# Patient Record
Sex: Male | Born: 1960 | Race: White | Hispanic: No | Marital: Single | Smoking: Never smoker
Health system: Southern US, Community
[De-identification: ages and names within clinical notes are randomized; demographics above are authoritative.]

## PROBLEM LIST (undated history)

## (undated) DIAGNOSIS — G4730 Sleep apnea, unspecified: Secondary | ICD-10-CM

## (undated) DIAGNOSIS — E119 Type 2 diabetes mellitus without complications: Secondary | ICD-10-CM

## (undated) DIAGNOSIS — I10 Essential (primary) hypertension: Secondary | ICD-10-CM

## (undated) DIAGNOSIS — J45909 Unspecified asthma, uncomplicated: Secondary | ICD-10-CM

## (undated) DIAGNOSIS — IMO0002 Reserved for concepts with insufficient information to code with codable children: Secondary | ICD-10-CM

## (undated) HISTORY — PX: CARDIAC CATHETERIZATION: SHX172

## (undated) HISTORY — PX: LEG SURGERY: SHX1003

---

## 2014-11-18 ENCOUNTER — Inpatient Hospital Stay (HOSPITAL_COMMUNITY)
Admission: EM | Admit: 2014-11-18 | Discharge: 2014-11-21 | DRG: 292 | Disposition: A | Discharge status: Home / Self Care | Payer: Self-pay | Attending: Internal Medicine | Admitting: Internal Medicine

## 2014-11-18 ENCOUNTER — Emergency Department (HOSPITAL_COMMUNITY): Payer: Self-pay

## 2014-11-18 ENCOUNTER — Encounter (HOSPITAL_COMMUNITY): Payer: Self-pay

## 2014-11-18 DIAGNOSIS — R000 Tachycardia, unspecified: Secondary | ICD-10-CM | POA: Diagnosis present

## 2014-11-18 DIAGNOSIS — I43 Cardiomyopathy in diseases classified elsewhere: Secondary | ICD-10-CM | POA: Diagnosis present

## 2014-11-18 DIAGNOSIS — Z9114 Patient's other noncompliance with medication regimen: Secondary | ICD-10-CM | POA: Diagnosis present

## 2014-11-18 DIAGNOSIS — J45909 Unspecified asthma, uncomplicated: Secondary | ICD-10-CM | POA: Diagnosis present

## 2014-11-18 DIAGNOSIS — I5021 Acute systolic (congestive) heart failure: Secondary | ICD-10-CM

## 2014-11-18 DIAGNOSIS — I5023 Acute on chronic systolic (congestive) heart failure: Principal | ICD-10-CM | POA: Diagnosis present

## 2014-11-18 DIAGNOSIS — R51 Headache: Secondary | ICD-10-CM | POA: Diagnosis present

## 2014-11-18 DIAGNOSIS — E876 Hypokalemia: Secondary | ICD-10-CM | POA: Diagnosis not present

## 2014-11-18 DIAGNOSIS — Z91199 Patient's noncompliance with other medical treatment and regimen due to unspecified reason: Secondary | ICD-10-CM

## 2014-11-18 DIAGNOSIS — T501X5A Adverse effect of loop [high-ceiling] diuretics, initial encounter: Secondary | ICD-10-CM | POA: Diagnosis not present

## 2014-11-18 DIAGNOSIS — I10 Essential (primary) hypertension: Secondary | ICD-10-CM | POA: Diagnosis present

## 2014-11-18 DIAGNOSIS — I5020 Unspecified systolic (congestive) heart failure: Secondary | ICD-10-CM | POA: Insufficient documentation

## 2014-11-18 DIAGNOSIS — G4733 Obstructive sleep apnea (adult) (pediatric): Secondary | ICD-10-CM | POA: Diagnosis present

## 2014-11-18 DIAGNOSIS — Z885 Allergy status to narcotic agent status: Secondary | ICD-10-CM

## 2014-11-18 DIAGNOSIS — R319 Hematuria, unspecified: Secondary | ICD-10-CM | POA: Diagnosis present

## 2014-11-18 DIAGNOSIS — R0602 Shortness of breath: Secondary | ICD-10-CM

## 2014-11-18 DIAGNOSIS — E119 Type 2 diabetes mellitus without complications: Secondary | ICD-10-CM | POA: Diagnosis present

## 2014-11-18 DIAGNOSIS — I110 Hypertensive heart disease with heart failure: Secondary | ICD-10-CM | POA: Diagnosis present

## 2014-11-18 DIAGNOSIS — Z9119 Patient's noncompliance with other medical treatment and regimen: Secondary | ICD-10-CM

## 2014-11-18 DIAGNOSIS — Z6841 Body Mass Index (BMI) 40.0 and over, adult: Secondary | ICD-10-CM

## 2014-11-18 HISTORY — DX: Sleep apnea, unspecified: G47.30

## 2014-11-18 HISTORY — DX: Essential (primary) hypertension: I10

## 2014-11-18 HISTORY — DX: Unspecified asthma, uncomplicated: J45.909

## 2014-11-18 HISTORY — DX: Reserved for concepts with insufficient information to code with codable children: IMO0002

## 2014-11-18 HISTORY — DX: Type 2 diabetes mellitus without complications: E11.9

## 2014-11-18 LAB — BASIC METABOLIC PANEL
Anion gap: 11 (ref 5–15)
BUN: 14 mg/dL (ref 6–23)
CO2: 26 mmol/L (ref 19–32)
Calcium: 9.1 mg/dL (ref 8.4–10.5)
Chloride: 103 mEq/L (ref 96–112)
Creatinine, Ser: 1.4 mg/dL — ABNORMAL HIGH (ref 0.50–1.35)
GFR calc Af Amer: 65 mL/min — ABNORMAL LOW (ref >90)
GFR calc non Af Amer: 56 mL/min — ABNORMAL LOW (ref >90)
Glucose, Bld: 132 mg/dL — ABNORMAL HIGH (ref 70–99)
Potassium: 3.4 mmol/L — ABNORMAL LOW (ref 3.5–5.1)
SODIUM: 140 mmol/L (ref 135–145)

## 2014-11-18 LAB — BRAIN NATRIURETIC PEPTIDE: B NATRIURETIC PEPTIDE 5: 729.3 pg/mL — AB (ref 0.0–100.0)

## 2014-11-18 LAB — I-STAT TROPONIN, ED
Troponin i, poc: 0.04 ng/mL (ref 0.00–0.08)
Troponin i, poc: 0.05 ng/mL (ref 0.00–0.08)

## 2014-11-18 LAB — CBC
HEMATOCRIT: 40 % (ref 39.0–52.0)
HEMOGLOBIN: 12.8 g/dL — AB (ref 13.0–17.0)
MCH: 25.4 pg — ABNORMAL LOW (ref 26.0–34.0)
MCHC: 32 g/dL (ref 30.0–36.0)
MCV: 79.4 fL (ref 78.0–100.0)
PLATELETS: 296 10*3/uL (ref 150–400)
RBC: 5.04 MIL/uL (ref 4.22–5.81)
RDW: 15.1 % (ref 11.5–15.5)
WBC: 8.2 10*3/uL (ref 4.0–10.5)

## 2014-11-18 LAB — GLUCOSE, CAPILLARY: Glucose-Capillary: 168 mg/dL — ABNORMAL HIGH (ref 70–99)

## 2014-11-18 LAB — D-DIMER, QUANTITATIVE (NOT AT ARMC): D DIMER QUANT: 1.53 ug{FEU}/mL — AB (ref 0.00–0.48)

## 2014-11-18 LAB — TSH: TSH: 1.174 u[IU]/mL (ref 0.350–4.500)

## 2014-11-18 LAB — CBG MONITORING, ED: Glucose-Capillary: 124 mg/dL — ABNORMAL HIGH (ref 70–99)

## 2014-11-18 LAB — TROPONIN I: Troponin I: 0.05 ng/mL — ABNORMAL HIGH (ref <0.031)

## 2014-11-18 MED ORDER — ONDANSETRON HCL 4 MG/2ML IJ SOLN: 4.0000 mg | Freq: Four times a day (QID) | INTRAMUSCULAR | Status: DC | PRN

## 2014-11-18 MED ORDER — FUROSEMIDE 10 MG/ML IJ SOLN
20.0000 mg | Freq: Once | INTRAMUSCULAR | Status: AC
  Administered 2014-11-18: 20 mg via INTRAVENOUS
  Filled 2014-11-18: qty 2

## 2014-11-18 MED ORDER — ACETAMINOPHEN 325 MG PO TABS
650.0000 mg | ORAL_TABLET | Freq: Once | ORAL | Status: AC
  Administered 2014-11-18: 650 mg via ORAL
  Filled 2014-11-18: qty 2

## 2014-11-18 MED ORDER — ACETAMINOPHEN 325 MG PO TABS
650.0000 mg | ORAL_TABLET | ORAL | Status: DC | PRN
  Administered 2014-11-19 – 2014-11-21 (×8): 650 mg via ORAL
  Filled 2014-11-18 (×9): qty 2

## 2014-11-18 MED ORDER — FUROSEMIDE 10 MG/ML IJ SOLN
40.0000 mg | Freq: Every day | INTRAMUSCULAR | Status: DC
  Administered 2014-11-18 – 2014-11-20 (×3): 40 mg via INTRAVENOUS
  Filled 2014-11-18 (×4): qty 4

## 2014-11-18 MED ORDER — CARVEDILOL 3.125 MG PO TABS
3.1250 mg | ORAL_TABLET | Freq: Two times a day (BID) | ORAL | Status: DC
  Administered 2014-11-19 (×2): 3.125 mg via ORAL
  Filled 2014-11-18 (×3): qty 1

## 2014-11-18 MED ORDER — IOHEXOL 350 MG/ML SOLN
100.0000 mL | Freq: Once | INTRAVENOUS | Status: AC | PRN
  Administered 2014-11-18: 100 mL via INTRAVENOUS

## 2014-11-18 MED ORDER — MOMETASONE FURO-FORMOTEROL FUM 100-5 MCG/ACT IN AERO
2.0000 | INHALATION_SPRAY | Freq: Two times a day (BID) | RESPIRATORY_TRACT | Status: DC
  Administered 2014-11-18 – 2014-11-21 (×5): 2 via RESPIRATORY_TRACT
  Filled 2014-11-18: qty 8.8

## 2014-11-18 MED ORDER — SODIUM CHLORIDE 0.9 % IJ SOLN: 3.0000 mL | INTRAMUSCULAR | Status: DC | PRN

## 2014-11-18 MED ORDER — ASPIRIN EC 325 MG PO TBEC
325.0000 mg | DELAYED_RELEASE_TABLET | Freq: Every day | ORAL | Status: DC
  Administered 2014-11-18 – 2014-11-21 (×4): 325 mg via ORAL
  Filled 2014-11-18 (×4): qty 1

## 2014-11-18 MED ORDER — HEPARIN SODIUM (PORCINE) 5000 UNIT/ML IJ SOLN
5000.0000 [IU] | Freq: Three times a day (TID) | INTRAMUSCULAR | Status: DC
  Administered 2014-11-18 – 2014-11-21 (×9): 5000 [IU] via SUBCUTANEOUS
  Filled 2014-11-18 (×10): qty 1

## 2014-11-18 MED ORDER — SODIUM CHLORIDE 0.9 % IJ SOLN
3.0000 mL | Freq: Two times a day (BID) | INTRAMUSCULAR | Status: DC
  Administered 2014-11-18 – 2014-11-21 (×6): 3 mL via INTRAVENOUS

## 2014-11-18 MED ORDER — ALBUTEROL SULFATE (2.5 MG/3ML) 0.083% IN NEBU
3.0000 mL | INHALATION_SOLUTION | Freq: Four times a day (QID) | RESPIRATORY_TRACT | Status: DC | PRN
  Administered 2014-11-20: 3 mL via RESPIRATORY_TRACT
  Filled 2014-11-18: qty 3

## 2014-11-18 MED ORDER — PREDNISONE 20 MG PO TABS
60.0000 mg | ORAL_TABLET | Freq: Once | ORAL | Status: AC
  Administered 2014-11-18: 60 mg via ORAL
  Filled 2014-11-18: qty 3

## 2014-11-18 MED ORDER — LISINOPRIL 2.5 MG PO TABS
2.5000 mg | ORAL_TABLET | Freq: Every day | ORAL | Status: DC
  Administered 2014-11-18 – 2014-11-19 (×2): 2.5 mg via ORAL
  Filled 2014-11-18 (×2): qty 1

## 2014-11-18 MED ORDER — NITROGLYCERIN 2 % TD OINT
0.5000 [in_us] | TOPICAL_OINTMENT | Freq: Four times a day (QID) | TRANSDERMAL | Status: DC
  Administered 2014-11-18 – 2014-11-19 (×2): 0.5 [in_us] via TOPICAL
  Filled 2014-11-18: qty 30

## 2014-11-18 MED ORDER — ZOLPIDEM TARTRATE 5 MG PO TABS
5.0000 mg | ORAL_TABLET | Freq: Every evening | ORAL | Status: DC | PRN
  Administered 2014-11-19 – 2014-11-21 (×2): 5 mg via ORAL
  Filled 2014-11-18 (×2): qty 1

## 2014-11-18 MED ORDER — IPRATROPIUM-ALBUTEROL 0.5-2.5 (3) MG/3ML IN SOLN
3.0000 mL | Freq: Once | RESPIRATORY_TRACT | Status: AC
  Administered 2014-11-18: 3 mL via RESPIRATORY_TRACT
  Filled 2014-11-18: qty 3

## 2014-11-18 MED ORDER — INSULIN ASPART 100 UNIT/ML ~~LOC~~ SOLN
0.0000 [IU] | Freq: Three times a day (TID) | SUBCUTANEOUS | Status: DC
  Administered 2014-11-19: 3 [IU] via SUBCUTANEOUS
  Administered 2014-11-19: 4 [IU] via SUBCUTANEOUS
  Administered 2014-11-19: 3 [IU] via SUBCUTANEOUS
  Administered 2014-11-20: 7 [IU] via SUBCUTANEOUS
  Administered 2014-11-20 – 2014-11-21 (×4): 3 [IU] via SUBCUTANEOUS
  Administered 2014-11-21: 4 [IU] via SUBCUTANEOUS

## 2014-11-18 MED ORDER — SODIUM CHLORIDE 0.9 % IV SOLN: 250.0000 mL | INTRAVENOUS | Status: DC | PRN

## 2014-11-18 MED ORDER — POTASSIUM CHLORIDE CRYS ER 20 MEQ PO TBCR
20.0000 meq | EXTENDED_RELEASE_TABLET | Freq: Once | ORAL | Status: AC
  Administered 2014-11-18: 20 meq via ORAL
  Filled 2014-11-18: qty 1

## 2014-11-18 NOTE — H&P (Signed)
[facility] History and Physical  Tyler MajorsDonald Hays ZOX:[id number] DOB: 1961-07-12 DOA: 11/18/2014  Referring physician: Oswaldo ConroyVictoria Creech PA PCP: No PCP Per Patient   Chief Complaint: Shortness of breath  HPI: Tyler MajorsDonald Hays is a 54 y.o. male presents with shortness of breath. He states that his asthma has been acting up since thanksgiving. Patient stats that he has not been taking any of his medications. Patient states he has not taken meds for 6 months. Apparently was in a workman comp case and has not gotten any meds. He states that he has a history of cardiomyopathy due to hypertension. Patient states that he also has a chiari malformation. Patient states he does not smoke. He does not drink regularly. Patient states he has noted his legs are more swollen. He has cat scratches on his legs. He is also a diabetic and is not taking his medications. He was on metformin. He states that he was taking over the counter medications for asthma. He is also on CPAP for OSA at a pressure of 12CWP   Review of Systems:  Constitutional:  No weight loss, night sweats, Fevers, chills, ++fatigue.  HEENT:  No headaches,  nasal congestion, post nasal drip,  Cardio-vascular:  No chest pain, ++Orthopnea, ++PND, ++swelling in lower extremities GI:  No heartburn, indigestion, abdominal pain, nausea, vomiting, diarrhea  Resp:  ++shortness of breath with exertion and at rest. No coughing up of blood. ++wheezing.  Skin:  Cat scratch marks noted GU:  no dysuria, change in color of urine, no urgency or frequency.  Musculoskeletal:  No joint pain or swelling. No decreased range of motion  Psych:  No change in mood or affect. No depression or anxiety. No memory loss.   Past Medical History  Diagnosis Date  . Chiari malformation   . Hypertension   . Diabetes mellitus without complication   . Asthma   . Sleep apnea    Past Surgical History  Procedure Laterality Date  . Cardiac catheterization  2010 &  2013  . Leg surgery Right    Social History:  reports that he has never smoked. He does not have any smokeless tobacco history on file. He reports that he does not drink alcohol or use illicit drugs.  Allergies  Allergen Reactions  . Tramadol Nausea And Vomiting    No family history on file.   Prior to Admission medications   Not on File   Physical Exam: Filed Vitals:   11/18/14 1530 11/18/14 1730 11/18/14 1800 11/18/14 1830  BP: 166/99 175/119 189/113 191/109  Pulse: 108 116 110 114  Temp:      TempSrc:      Resp: 33 33 33 30  Height:      Weight:      SpO2: 100% 95% 97% 99%    Wt Readings from Last 3 Encounters:  11/18/14 159.213 kg (351 lb)    General:  Appears calm and comfortable Eyes: PERRL, normal lids, irises & conjunctiva ENT: grossly normal hearing, lips & tongue Neck: no LAD, masses or thyromegaly Cardiovascular: RRR, no m/r/g. ++ LE edema. Respiratory: ++ronchi and crackles. Normal respiratory effort. Abdomen: soft, ntnd Skin: multiple scratch marks noted Musculoskeletal: grossly normal tone BUE/BLE Psychiatric: grossly normal mood and affect Neurologic: grossly non-focal.          Labs on Admission:  Basic Metabolic Panel:  Recent Labs Lab 11/18/14 1257  NA 140  K 3.4*  CL 103  CO2 26  GLUCOSE 132*  BUN 14  CREATININE  1.40*  CALCIUM 9.1   Liver Function Tests: No results for input(s): AST, ALT, ALKPHOS, BILITOT, PROT, ALBUMIN in the last 168 hours. No results for input(s): LIPASE, AMYLASE in the last 168 hours. No results for input(s): AMMONIA in the last 168 hours. CBC:  Recent Labs Lab 11/18/14 1257  WBC 8.2  HGB 12.8*  HCT 40.0  MCV 79.4  PLT 296   Cardiac Enzymes: No results for input(s): CKTOTAL, CKMB, CKMBINDEX, TROPONINI in the last 168 hours.  BNP (last 3 results) No results for input(s): PROBNP in the last 8760 hours. CBG:  Recent Labs Lab 11/18/14 1257  GLUCAP 124*    Radiological Exams on Admission: Dg  Chest 2 View  11/18/2014   CLINICAL DATA:  Short of breath  EXAM: CHEST  2 VIEW  COMPARISON:  None.  FINDINGS: Cardiac enlargement. Negative for heart failure or pneumonia. Mild right lower lobe atelectasis. Negative for pleural effusion.  IMPRESSION: Cardiac enlargement without heart failure. Mild right lower lobe atelectasis.   Electronically Signed   By: Marlan Palau M.D.   On: 11/18/2014 14:03   Ct Angio Chest Pe W/cm &/or Wo Cm  11/18/2014   CLINICAL DATA:  Shortness of breath.  History of cardiomyopathy.  EXAM: CT ANGIOGRAPHY CHEST WITH CONTRAST  TECHNIQUE: Multidetector CT imaging of the chest was performed using the standard protocol during bolus administration of intravenous contrast. Multiplanar CT image reconstructions and MIPs were obtained to evaluate the vascular anatomy.  CONTRAST:  OMNIPAQUE IOHEXOL 350 MG/ML SOLN  COMPARISON:  Radiographs earlier the same day.  FINDINGS: Mild-moderate patient 3 motion artifact. Allowing for this, there are no filling defects in the pulmonary arteries to suggest pulmonary embolus. The thoracic aorta is normal in caliber.  There is multi chamber cardiomegaly with contrast refluxing into the IVC and hepatic veins. Small bilateral pleural effusions. There is no pericardial effusion. There is mild mediastinal and bilateral hilar adenopathy, likely reactive.  Lung parenchymal evaluation limited due to breathing motion, there is questionable mild ground-glass opacity diffusely. Linear atelectasis in the periphery of the right upper lobe. No confluent airspace disease to suggest pneumonia. No pulmonary mass.  Evaluation of the upper abdomen demonstrates fatty atrophy of the included pancreas. There is hepatic steatosis.  Degenerative change in the mid thoracic spine. There is no acute or suspicious osseous abnormality.  Review of the MIP images confirms the above findings.  IMPRESSION: 1. Motion limited exam, no evidence pulmonary embolus. 2. Cardiomegaly, small  bilateral pleural effusions and contrast refluxing into the hepatic vein and IVC, suggestive of heart failure. Questionable ground-glass opacities may reflect pulmonary edema. 3. Mild mediastinal and bilateral hilar adenopathy, this is likely reactive.   Electronically Signed   By: Rubye Oaks M.D.   On: 11/18/2014 17:19      Assessment/Plan Active Problems:   Hypertension   Type 2 diabetes mellitus   Systolic heart failure   1. Systolic Heart Failure -decompensated due to non-compliance with medications -will diurese now -resume medications -check echo -heart failure team consult -started on ACE and Beta Blockers  2. Type 2 diabetes mellitus -will check FSBS -insulin coverage as needed -patient not sure regarding dosage will get pharmacy evaluation  3. Hypertension -need to resume home medications -monitor pressures  4. Asthma -will start on home medications -duonebs as needed     Code Status: Full Code indicate code status--if unknown or must be presumed, indicate so) DVT Prophylaxis:heparin Family Communication: significant other (indicate person spoken with, if applicable,  with phone number if by telephone) Disposition Plan: Heparin (indicate anticipated LOS)  Time spent:  [facility] Pager [phone]

## 2014-11-18 NOTE — ED Notes (Signed)
Pt transported to CT ?

## 2014-11-18 NOTE — Progress Notes (Signed)
Admitted pt from ED AAOX4 oriented to room and call bell. Tele on. VS taken and recorded.

## 2014-11-18 NOTE — ED Provider Notes (Signed)
CSN: [id number]     Arrival date & time 11/18/14  1220 History   First MD Initiated Contact with Patient 11/18/14 1351     Chief Complaint  Patient presents with  . Shortness of Breath  . Asthma     (Consider location/radiation/quality/duration/timing/severity/associated sxs/prior Treatment) HPI  Tyler Hays is a 54 y.o. male with PMH of chiari malformation, HTN, DM, Asthma, cardiomyopathy presenting with persistent SOB since Thanksgiving. Pt endorses increasing severity of his shortness of breath. Shortness of breath worse with ambulation. Patient denies any chest pain. Patient has been taking albuterol nebulizers at home with mild improvement but then his shortness of breath returns. Pt denies any leg swelling or tenderness. No history of DVT, PE, recent surgeries or trauma, malignancy, hemoptysis. Patient is obese and has had recent trip from [place] was 21 [place] immediately stop 3 times. Patient endorses sedentary lifestyle. Patient denies any fevers, chills, nausea, vomiting, abdominal pain, cough or URI symptoms. Patient states he has had cardiac catheterizations in 2010 and 2013 without occlusions. He is also with history of diabetes and has been taking metformin but not for the past 4 months.   Past Medical History  Diagnosis Date  . Chiari malformation   . Hypertension   . Diabetes mellitus without complication   . Asthma   . Sleep apnea    Past Surgical History  Procedure Laterality Date  . Cardiac catheterization  2010 & 2013  . Leg surgery Right    No family history on file. History  Substance Use Topics  . Smoking status: Never Smoker   . Smokeless tobacco: Not on file  . Alcohol Use: No    Review of Systems 10 Systems reviewed and are negative for acute change except as noted in the HPI.    Allergies  Tramadol  Home Medications   Prior to Admission medications   Not on File   BP 162/94 mmHg  Pulse 107  Temp(Src) 97.8 F (36.6 C) (Oral)  Resp  22  Ht 5\' 7"  (1.702 m)  Wt 351 lb (159.213 kg)  BMI 54.96 kg/m2  SpO2 100% Physical Exam  Constitutional: He appears well-developed and well-nourished. No distress.  HENT:  Head: Normocephalic and atraumatic.  Eyes: Conjunctivae and EOM are normal. Right eye exhibits no discharge. Left eye exhibits no discharge.  Neck: No JVD present.  Cardiovascular: Normal rate, regular rhythm and normal heart sounds.   No leg swelling or tenderness. Negative Homan's sign.  Pulmonary/Chest: Effort normal and breath sounds normal. No respiratory distress.  Diffuse wheezing with diminished air movement. Mild respiratory distress. No accessory muscle use. Symmetrical chest expansion.  Abdominal: Soft. Bowel sounds are normal. He exhibits no distension. There is no tenderness.  Neurological: He is alert. He exhibits normal muscle tone. Coordination normal.  Skin: Skin is warm and dry. He is not diaphoretic.  Nursing note and vitals reviewed.   ED Course  Procedures (including critical care time) Labs Review Labs Reviewed  BRAIN NATRIURETIC PEPTIDE - Abnormal; Notable for the following:    B Natriuretic Peptide 729.3 (*)    All other components within normal limits  BASIC METABOLIC PANEL - Abnormal; Notable for the following:    Potassium 3.4 (*)    Glucose, Bld 132 (*)    Creatinine, Ser 1.40 (*)    GFR calc non Af Amer 56 (*)    GFR calc Af Amer 65 (*)    All other components within normal limits  CBC - Abnormal; Notable for  the following:    Hemoglobin 12.8 (*)    MCH 25.4 (*)    All other components within normal limits  D-DIMER, QUANTITATIVE - Abnormal; Notable for the following:    D-Dimer, Quant 1.53 (*)    All other components within normal limits  CBG MONITORING, ED - Abnormal; Notable for the following:    Glucose-Capillary 124 (*)    All other components within normal limits  I-STAT TROPOININ, ED  Rosezena Sensor, ED    Imaging Review Dg Chest 2 View  11/18/2014   CLINICAL  DATA:  Short of breath  EXAM: CHEST  2 VIEW  COMPARISON:  None.  FINDINGS: Cardiac enlargement. Negative for heart failure or pneumonia. Mild right lower lobe atelectasis. Negative for pleural effusion.  IMPRESSION: Cardiac enlargement without heart failure. Mild right lower lobe atelectasis.   Electronically Signed   By: Marlan Palau M.D.   On: 11/18/2014 14:03     EKG Interpretation None      MDM   Final diagnoses:  Shortness of breath   Patient with past medical history of asthma presenting with increasing shortness of breath since Thanksgiving. He stated he was using his family's inhalers with improvement of his symptoms. He denies any chest pain. Patient low risk for PE other than his tachycardia as well as immobility and obesity. Well's score less than 4. Other than tachycardia patient's vital signs stable with oxygen sats above 94% on RA. Pt ambulated with O2 stats above 94% while ambulating. Patient with diffuse wheezing with diminished air movement. Mild respiratory distress. EKG with sinus tachycardia without evidence of ischemia and negative troponin. D-dimer 1.53. CTA ordered and pending. If negative pt to be discharged home with prednisone. Pt states he has script for inhaler. Patient without a PCP. Patient to establish care and follow up. ED resources provided. Pt appears reliable for follow up and is agreeable to discharge.   Pt signed out to Tyler Monday, MD at shift change.  Discussed all results and patient verbalizes understanding and agrees with plan.  This is a shared patient. This patient was discussed with the physician, Dr. Manus Gunning who saw and evaluated the patient and agrees with the plan.   Louann Sjogren, PA-C 11/18/14 1610  Glynn Octave, MD 11/18/14 [phone]

## 2014-11-18 NOTE — ED Notes (Signed)
Pt Ambulated with Pulse Ox. Pt's O2 was at 99% when stood up from bed. Pt dropped to 95% while ambulating. Pt helped back into bed and hooked up to monitor. Pt's O2 is at 97% currently.

## 2014-11-18 NOTE — ED Notes (Signed)
Attempted report x1. 

## 2014-11-18 NOTE — ED Notes (Addendum)
[place] is [place]. Has been having increase in SOB since Thanksgiving. [facility] so [facility] here. Feels like his asthma is bothering him too. Is diabetic but hasn't been on meds since July. Hx of cardiomyopathy. Denies any swelling of extremities. [place] trip from [place] and stopped 3 times to get gas.

## 2014-11-18 NOTE — Discharge Instructions (Signed)
Return to the emergency room with worsening of symptoms, new symptoms or with symptoms that are concerning, especially fevers, worsening SOB, developing chest pain that feels like a pressure, spreads to left arm or jaw, worse with exertion, associated with nausea, vomiting, shortness of breath and/or sweating, you feel faint, cough up blood.  Please call your doctor for a followup appointment within 24-48 hours. When you talk to your doctor please let them know that you were seen in the emergency department and have them acquire all of your records so that they can discuss the findings with you and formulate a treatment plan to fully care for your new and ongoing problems. If you do not have a primary care provider please call the number below under ED resources to establish care with a provider and follow up.    Shortness of Breath Shortness of breath means you have trouble breathing. It could also mean that you have a medical problem. You should get immediate medical care for shortness of breath. CAUSES   Not enough oxygen in the air such as with high altitudes or a smoke-filled room.  Certain lung diseases, infections, or problems.  Heart disease or conditions, such as angina or heart failure.  Low red blood cells (anemia).  Poor physical fitness, which can cause shortness of breath when you exercise.  Chest or back injuries or stiffness.  Being overweight.  Smoking.  Anxiety, which can make you feel like you are not getting enough air. DIAGNOSIS  Serious medical problems can often be found during your physical exam. Tests may also be done to determine why you are having shortness of breath. Tests may include:  Chest X-rays.  Lung function tests.  Blood tests.  An electrocardiogram (ECG).  An ambulatory electrocardiogram. An ambulatory ECG records your heartbeat patterns over a 24-hour period.  Exercise testing.  A transthoracic echocardiogram (TTE). During  echocardiography, sound waves are used to evaluate how blood flows through your heart.  A transesophageal echocardiogram (TEE).  Imaging scans. Your health care provider may not be able to find a cause for your shortness of breath after your exam. In this case, it is important to have a follow-up exam with your health care provider as directed.  TREATMENT  Treatment for shortness of breath depends on the cause of your symptoms and can vary greatly. HOME CARE INSTRUCTIONS   Do not smoke. Smoking is a common cause of shortness of breath. If you smoke, ask for help to quit.  Avoid being around chemicals or things that may bother your breathing, such as paint fumes and dust.  Rest as needed. Slowly resume your usual activities.  If medicines were prescribed, take them as directed for the full length of time directed. This includes oxygen and any inhaled medicines.  Keep all follow-up appointments as directed by your health care provider. SEEK MEDICAL CARE IF:   Your condition does not improve in the time expected.  You have a hard time doing your normal activities even with rest.  You have any new symptoms. SEEK IMMEDIATE MEDICAL CARE IF:   Your shortness of breath gets worse.  You feel light-headed, faint, or develop a cough not controlled with medicines.  You start coughing up blood.  You have pain with breathing.  You have chest pain or pain in your arms, shoulders, or abdomen.  You have a fever.  You are unable to walk up stairs or exercise the way you normally do. MAKE SURE YOU:  Understand these  instructions.  Will watch your condition.  Will get help right away if you are not doing well or get worse. Document Released: 07/26/2001 Document Revised: 11/05/2013 Document Reviewed: 01/16/2012 [facility] Patient Information 2015 [place], [place]. This information is not intended to replace advice given to you by your health care provider. Make sure you discuss any questions  you have with your health care provider.   Emergency Department Resource Guide 1) Find a Doctor and Pay Out of Pocket Although you won't have to find out who is covered by your insurance plan, it is a good idea to ask around and get recommendations. You will then need to call the office and see if the doctor you have chosen will accept you as a new patient and what types of options they offer for patients who are self-pay. Some doctors offer discounts or will set up payment plans for their patients who do not have insurance, but you will need to ask so you aren't surprised when you get to your appointment.  2) Contact Your Local Health Department Not all health departments have doctors that can see patients for sick visits, but many do, so it is worth a call to see if yours does. If you don't know where your local health department is, you can check in your phone book. The CDC also has a tool to help you locate your state's health department, and many state websites also have listings of all of their local health departments.  3) Find a Walk-in Clinic If your illness is not likely to be very severe or complicated, you may want to try a walk in clinic. These are popping up all over the country in pharmacies, drugstores, and shopping centers. They're usually staffed by nurse practitioners or physician assistants that have been trained to treat common illnesses and complaints. They're usually fairly quick and inexpensive. However, if you have serious medical issues or chronic medical problems, these are probably not your best option.  No Primary Care Doctor: - Call Health Connect at  [phone] - they can help you locate a primary care doctor that  accepts your insurance, provides certain services, etc. - Physician Referral Service- [phone]  Chronic Pain Problems: Organization         Address  Phone   Notes  [facility]  [phone] Patients need to be referred by their  primary care doctor.   Medication Assistance: Organization         Address  Phone   Notes  [facility] Medication [facility] [address] [place] [place], [place] [address] [phone] --Must be a resident of [facility] -- Must have NO insurance coverage whatsoever (no Medicaid/ Medicare, etc.) -- The pt. MUST have a primary care doctor that directs their care regularly and follows them in the community   MedAssist  [phone]   [employer]  [phone]    Agencies that provide inexpensive medical care: Organization         Address  Phone   Notes  [facility]  [phone]   [facility]    [phone]   [facility] [address] [place], [place] [address] [phone]   [facility] [address] [place], [place] [phone]   Planned Parenthood    [phone]   [facility]    [phone]   Community Health and [facility]  [address], [place] Phone:  [phone]) [phone], Fax:  [phone] Hours of Operation:  9 am - 6 pm, M-F.  Also accepts Medicaid/Medicare and self-pay.  [facility]  [address], [place] Phone: [phone], Fax: [phone]. Hours of Operation:  8:30 am - 5:30 pm, M-F.  Also accepts Medicaid and self-pay.  [facility] [place] [address], [place] Phone: [phone]) [phone]   [facility] [address], [place], [place] [phone], Ext. [phone] Mondays & Thursdays: 7-9 AM.  First 15 patients are seen on a first come, first serve basis.    Medicaid-accepting [facility] Providers:  Organization         Address  Phone   Notes  [facility] [address], [place] [phone] Also accepts self-pay patients.  [facility] [address], [place]  [phone]   [facility] [address], [place] [phone]   [facility] [address] [place] [address], [place] [phone]) [phone]   Renaye RakersVeita Bland [address], [place]   [phone] Only accepts [place] Access [place]icaid patients after they have their name applied to their card.   Self-Pay (no insurance) in [facility]:  Organization         Address  Phone   Notes  Sickle Cell Patients, [facility] [address], [place] [phone]) [phone]   [facility] [address], [place] [phone]) [phone]   [facility]  [address], [place] [phone]   [facility]/Dr. Osei-Bonsu  [address] [place] [address], [place] or [id number], [place] [phone] Phone number for both [place] and [place] locations is the same.  [facility]gent [facility] and [facility] [address], [place] [phone]   [facility] [address] [place] [address], [place] or [address] [phone]) [phone]   [facility] [address], [place] [phone]) [phone], phone; [phone](336) [phone], fax Sees patients 1st and 3rd Saturday of every month.  Must not qualify for public or private insurance (i.e. Medicaid, Medicare, [place] Health Choice, Veterans' Benefits)  Household income should be no more than 200% of the poverty level The clinic cannot treat you if you are pregnant or think you are pregnant  Sexually transmitted diseases are not treated at the clinic.    Dental Care: Organization         Address  Phone  Notes  [facility] [address], [place] [phone] Accepts children up to age 53 who are enrolled in [place]icaid or [place] Health Choice; pregnant women with a Medicaid card; and children who have applied for Medicaid or [place] Health Choice, but were declined, whose parents can pay a reduced fee  at time of service.  [facility] [place]  [address], [place] [phone] Accepts children up to age 53 who are enrolled in [place] or [place] Health Choice; pregnant women with a Medicaid card; and children who have applied for Medicaid or [place] Health Choice, but were declined, whose parents can pay a reduced fee at time of service.  [facility]  [address], [place] [phone] Patients are  seen by appointment only. Walk-ins are not accepted. [facility] will see patients 18 years of age and older. Monday - Tuesday (8am-5pm) Most Wednesdays (8:30-5pm) $30 per visit, cash only  [facility]  [address], [facility] [place] [phone] Patients are seen by appointment only. Walk-ins are not accepted. [facility] will see patients 53 years of age and older. One Wednesday Evening (Monthly: Volunteer Based).  $30 per visit, cash only  [employer] [facility]  [phone] for adults; Children under age 284, call Graduate Pediatric Dentistry at [phone]. Children aged 204-14, please call [phone] to request a pediatric application.  Dental services are provided in all areas of dental care including fillings, crowns and bridges, complete and partial dentures, implants, gum treatment, root canals, and extractions. Preventive care is also provided. Treatment is provided to both adults and children. Patients are selected via a lottery and there is often a waiting list.   [facility] [address], [place]  [phone] [email or web address]   [facility] [address], [place], [place] [phone], Ext. [phone] Second and Fourth Thursday of each month, opens at 6:30 AM; Clinic ends at 9 AM.  Patients are seen on a first-come first-served basis, and a limited number are seen during each clinic.   [facility]  [address],  [place], [place] [phone]) [phone]   Eligibility Requirements You must have lived in [place], [place], or [place] counties for at least the last three months.   You cannot be eligible for state or federal sponsored National Cityhealthcare insurance, including CIGNAVeterans Administration, [place]icaid, or [employer]Medicare.   You generally cannot be eligible for healthcare insurance through your employer.    How to apply: Eligibility screenings are held every Tuesday and Wednesday afternoon from 1:00 pm until 4:00 pm. You do not need an appointment for the interview!  [facility] [address], [place], [place] [phone]   [facility]  [phone]   [facility]  [phone]   [facility]  [phone]    Behavioral Health Resources in the Community: Intensive Outpatient Programs Organization         Address  Phone  Notes  [facility] [address], [place], [place] [phone]   [facility] [address], [place], [place] [phone]   [facility]: [facility] [address], [place], [place]  [phone]   [facility] [address],  [place], [place] [phone] or [phone]   Substance Abuse Resources Organization         Address  Phone  Notes  [facility] Drug Services  [phone]   [facility]  [phone]   [facility]  [phone]   [facility]  [phone]   Residential & Outpatient Substance Abuse Program  [phone]   Psychological Services Organization         Address  Phone  Notes  [facility]  [phone]- [phone]   [facility]  [phone]   [facility] [address], [place] [phone] or [phone]    Mobile Crisis Teams Organization         Address  Phone  Notes  [facility], Mobile Crisis Care Unit  [phone]   Assertive Psychotherapeutic  Services  [address] [place], [place] [phone]   [facility] DeEsch [address] [place]  [place] [phone]    Self-Help/Support Groups Organization         Address  Phone             Notes  [facility] [place] - variety of support groups  [phone]- I7437963 Call for more information  Narcotics Anonymous (NA), [facility] [address], [employer] [place]  2 meetings at this location   Statistician         Address  Phone  Notes  [facility] [address],    [place]  [phone]   [place]  [address] [place] [address], [place], [place] [phone]   [facility] [address] [place], [place] [phone] Admissions: 8am-3pm M-F  [facility] [address]    [place], [place] [phone]   [facility] [address] [place], [place], [place] [phone]   [facility] [address],  [place], [place] [phone]   [facility] - Intensive Outpatient [address], [place], [place] [phone]   [facility] ([facility] [address]  [place], [place] [phone] or [phone]   [facility] (RTS) [address], [place], [place] [phone] Accepts Medicaid  [facility] [address],  [place] [phone] Substance Abuse/Addiction Treatment   [facility] Organization         Address  Phone  Notes  [facility]  [phone]   Angie Fava, PhD [address] Kn[address] [place], [place]   [phone] or [phone]   [facility]   [address] [place], [place] [phone]   [facility] [address], [place], [place] [phone] Insurance/Medicaid/sponsorship through [facility] and Families [address]                                    [place], [place] [phone] Therapy/tele-psych/case  [facility] [address], [place] [phone]    Dr. Lolly Mustache  [phone]   [facility] [place]  [employer] [facility] 1) [address], [place] 2) [address] [place] 3)  [address], [place] [phone]  [phone]   [facility] Child Abuse Hotline 747-791-7242 or [phone] (After Hours)

## 2014-11-18 NOTE — ED Notes (Signed)
Admitting MD at bedside.

## 2014-11-18 NOTE — ED Notes (Signed)
CBG 124 

## 2014-11-19 ENCOUNTER — Encounter (HOSPITAL_COMMUNITY): Payer: Self-pay | Admitting: *Deleted

## 2014-11-19 DIAGNOSIS — I517 Cardiomegaly: Secondary | ICD-10-CM

## 2014-11-19 LAB — BASIC METABOLIC PANEL
ANION GAP: 12 (ref 5–15)
BUN: 20 mg/dL (ref 6–23)
CO2: 27 mmol/L (ref 19–32)
CREATININE: 1.63 mg/dL — AB (ref 0.50–1.35)
Calcium: 9.4 mg/dL (ref 8.4–10.5)
Chloride: 98 mEq/L (ref 96–112)
GFR calc Af Amer: 54 mL/min — ABNORMAL LOW (ref >90)
GFR, EST NON AFRICAN AMERICAN: 47 mL/min — AB (ref >90)
GLUCOSE: 259 mg/dL — AB (ref 70–99)
POTASSIUM: 3.8 mmol/L (ref 3.5–5.1)
Sodium: 137 mmol/L (ref 135–145)

## 2014-11-19 LAB — TROPONIN I
TROPONIN I: 0.06 ng/mL — AB (ref <0.031)
Troponin I: 0.05 ng/mL — ABNORMAL HIGH (ref <0.031)

## 2014-11-19 LAB — HEMOGLOBIN A1C
Hgb A1c MFr Bld: 7.5 % — ABNORMAL HIGH (ref <5.7)
Mean Plasma Glucose: 169 mg/dL — ABNORMAL HIGH (ref <117)

## 2014-11-19 LAB — GLUCOSE, CAPILLARY
GLUCOSE-CAPILLARY: 206 mg/dL — AB (ref 70–99)
Glucose-Capillary: 192 mg/dL — ABNORMAL HIGH (ref 70–99)
Glucose-Capillary: 145 mg/dL — ABNORMAL HIGH (ref 70–99)
Glucose-Capillary: 133 mg/dL — ABNORMAL HIGH (ref 70–99)

## 2014-11-19 MED ORDER — LISINOPRIL 5 MG PO TABS
5.0000 mg | ORAL_TABLET | Freq: Every day | ORAL | Status: DC
  Filled 2014-11-19: qty 1

## 2014-11-19 MED ORDER — CARVEDILOL 6.25 MG PO TABS
6.2500 mg | ORAL_TABLET | Freq: Two times a day (BID) | ORAL | Status: DC
  Administered 2014-11-20: 6.25 mg via ORAL
  Filled 2014-11-19 (×3): qty 1

## 2014-11-19 MED ORDER — HYDRALAZINE HCL 20 MG/ML IJ SOLN
10.0000 mg | Freq: Once | INTRAMUSCULAR | Status: AC
  Administered 2014-11-19: 10 mg via INTRAVENOUS
  Filled 2014-11-19: qty 1

## 2014-11-19 MED ORDER — PERFLUTREN LIPID MICROSPHERE
1.0000 mL | INTRAVENOUS | Status: AC | PRN
  Administered 2014-11-19: 4 mL via INTRAVENOUS
  Filled 2014-11-19: qty 10

## 2014-11-19 MED ORDER — IPRATROPIUM-ALBUTEROL 0.5-2.5 (3) MG/3ML IN SOLN: 3.0000 mL | RESPIRATORY_TRACT | Status: DC | PRN

## 2014-11-19 MED ORDER — LISINOPRIL 5 MG PO TABS
5.0000 mg | ORAL_TABLET | Freq: Once | ORAL | Status: AC
  Administered 2014-11-19: 5 mg via ORAL
  Filled 2014-11-19: qty 1

## 2014-11-19 NOTE — Progress Notes (Signed)
[facility] PROGRESS NOTE  Tyler MajorsDonald Hays UJW:[id number] DOB: [birth year] DOA: 11/18/2014 PCP: No PCP Per Patient  Assessment/Plan: 1. Acute on chronic systolic congestive heart failure -Likely secondary to medication nonadherence, patient reporting not following up with primary care provider over the last 4 months and consequently being off of his medications. -Transthoracic echocardiogram performed on 11/19/2014 showing moderate to severely reduced systolic function and having an ejection fraction of 30-35% -Troponin cycled and remained negative overnight -Patient having a net negative fluid balance of 7.085 L, with urinary output of 5.2 L the last 24 hours -Anticipate transitioning to oral Lasix in the next 24-48 hours  2.  Medication nonadherence -Patient was counseled, social work and case manager consulted for assistance with prescriptions along with sudden up with outpatient follow-up appointment  3.  Type 2 diabetes mellitus -Blood sugars remain controlled -Continue Accu-Cheks every 2 hours a daily at bedtime  4.  Hypertension -Blood pressures elevated -Will increase carvedilol from 3.125 mg by mouth twice a day to 6.25 mg by mouth twice a day, increase lisinopril to 5 mg by mouth daily from 2.5 mg -Follow blood pressures  Code Status: Full code Family Communication:  Disposition Plan: Anticipate discharge home when medically stable    Procedures:  Transthoracic echocardiogram performed on 11/19/2014 showing ejection fraction of 30-35%   HPI/Subjective: Patient is a 54 year old, with a past medical history of morbid obesity, obstructive sleep apnea, cardiomyopathy was admitted to the medicine service on 11/18/2014 when he presented with complaints of shortness of breath. He reported being off his medications for the past 4 months due to not following up with a primary care physician. Initial workup included a chest x-ray which showed cardiac enlargement thought  evidence of acute infiltrate. Symptoms felt to be secondary to acute decompensated congestive heart failure as he was started on IV Lasix, treated with 40 mg IV daily. On 11/19/2014 he had a transthoracic echocardiogram that showed an ejection fraction of 30-35%.   Objective: Filed Vitals:   11/19/14 1753  BP: 145/92  Pulse: 106  Temp:   Resp:     Intake/Output Summary (Last 24 hours) at 11/19/14 1755 Last data filed at 11/19/14 1545  Gross per 24 hour  Intake    840 ml  Output   7925 ml  Net  -7085 ml   Filed Weights   11/18/14 1253 11/18/14 1937 11/19/14 0524  Weight: 159.213 kg (351 lb) 155.947 kg (343 lb 12.8 oz) 152.091 kg (335 lb 4.8 oz)    Exam:   General:  Patient is awake alert oriented, calm, cooperative in no acute distress  Cardiovascular: Regular rate and rhythm normal S1-S2 has 2+ bilateral extremity pitting edema  Respiratory: Patient having bibasilar crackles, normal respiratory effort  Abdomen: Soft nontender nondistended positive bowel sounds  Musculoskeletal: 2+ bilateral extremity pitting edema  Data Reviewed: Basic Metabolic Panel:  Recent Labs Lab 11/18/14 1257 11/19/14 0111  NA 140 137  K 3.4* 3.8  CL 103 98  CO2 26 27  GLUCOSE 132* 259*  BUN 14 20  CREATININE 1.40* 1.63*  CALCIUM 9.1 9.4   Liver Function Tests: No results for input(s): AST, ALT, ALKPHOS, BILITOT, PROT, ALBUMIN in the last 168 hours. No results for input(s): LIPASE, AMYLASE in the last 168 hours. No results for input(s): AMMONIA in the last 168 hours. CBC:  Recent Labs Lab 11/18/14 1257  WBC 8.2  HGB 12.8*  HCT 40.0  MCV 79.4  PLT 296   Cardiac Enzymes:  Recent Labs  Lab 11/18/14 2041 11/19/14 0111 11/19/14 0731  TROPONINI 0.05* 0.05* 0.06*   BNP (last 3 results) No results for input(s): PROBNP in the last 8760 hours. CBG:  Recent Labs Lab 11/18/14 1257 11/18/14 1951 11/19/14 0610 11/19/14 1154 11/19/14 1652  GLUCAP 124* 168* 192* 145* 133*     No results found for this or any previous visit (from the past 240 hour(s)).   Studies: Dg Chest 2 View  11/18/2014   CLINICAL DATA:  Short of breath  EXAM: CHEST  2 VIEW  COMPARISON:  None.  FINDINGS: Cardiac enlargement. Negative for heart failure or pneumonia. Mild right lower lobe atelectasis. Negative for pleural effusion.  IMPRESSION: Cardiac enlargement without heart failure. Mild right lower lobe atelectasis.   Electronically Signed   By: Marlan Palau M.D.   On: 11/18/2014 14:03   Ct Angio Chest Pe W/cm &/or Wo Cm  11/18/2014   CLINICAL DATA:  Shortness of breath.  History of cardiomyopathy.  EXAM: CT ANGIOGRAPHY CHEST WITH CONTRAST  TECHNIQUE: Multidetector CT imaging of the chest was performed using the standard protocol during bolus administration of intravenous contrast. Multiplanar CT image reconstructions and MIPs were obtained to evaluate the vascular anatomy.  CONTRAST:  OMNIPAQUE IOHEXOL 350 MG/ML SOLN  COMPARISON:  Radiographs earlier the same day.  FINDINGS: Mild-moderate patient 3 motion artifact. Allowing for this, there are no filling defects in the pulmonary arteries to suggest pulmonary embolus. The thoracic aorta is normal in caliber.  There is multi chamber cardiomegaly with contrast refluxing into the IVC and hepatic veins. Small bilateral pleural effusions. There is no pericardial effusion. There is mild mediastinal and bilateral hilar adenopathy, likely reactive.  Lung parenchymal evaluation limited due to breathing motion, there is questionable mild ground-glass opacity diffusely. Linear atelectasis in the periphery of the right upper lobe. No confluent airspace disease to suggest pneumonia. No pulmonary mass.  Evaluation of the upper abdomen demonstrates fatty atrophy of the included pancreas. There is hepatic steatosis.  Degenerative change in the mid thoracic spine. There is no acute or suspicious osseous abnormality.  Review of the MIP images confirms the above  findings.  IMPRESSION: 1. Motion limited exam, no evidence pulmonary embolus. 2. Cardiomegaly, small bilateral pleural effusions and contrast refluxing into the hepatic vein and IVC, suggestive of heart failure. Questionable ground-glass opacities may reflect pulmonary edema. 3. Mild mediastinal and bilateral hilar adenopathy, this is likely reactive.   Electronically Signed   By: Rubye Oaks M.D.   On: 11/18/2014 17:19    Scheduled Meds: . aspirin EC  325 mg Oral Daily  . carvedilol  3.125 mg Oral BID WC  . furosemide  40 mg Intravenous Daily  . heparin  5,000 Units Subcutaneous 3 times per day  . insulin aspart  0-20 Units Subcutaneous TID WC  . [START ON 11/20/2014] lisinopril  5 mg Oral Daily  . mometasone-formoterol  2 puff Inhalation BID  . sodium chloride  3 mL Intravenous Q12H   Continuous Infusions:   Active Problems:   Hypertension   Type 2 diabetes mellitus   Systolic heart failure    Time spent: 35 min    Jeralyn Bennett  [facility] Pager [phone]. If 7PM-7AM, please contact night-coverage at [email or web address], password [facility] 11/19/2014, 5:55 PM  LOS: 1 day

## 2014-11-19 NOTE — Care Management Note (Signed)
    Page 1 of 2   11/21/2014     3:10:55 PM CARE MANAGEMENT NOTE 11/21/2014  Patient:  Tyler Hays, Tyler Hays   Account Number:  [id number]  Date Initiated:  11/19/2014  Documentation initiated by:  Jearl Soto  Subjective/Objective Assessment:   Pt adm on 11/18/14 with acute CHF.  PTA, pt independent, and visiting [place] from [place].  He is uninsured and currently has no PCP.     Action/Plan:   Met with pt to discuss dc plans/med and PCP follow up.   Anticipated DC Date:  11/22/2014   Anticipated DC Plan:  [place]  CM consult      Choice offered to / List presented to:     DME arranged  OTHER - SEE COMMENT      DME agency  OTHER - SEE NOTE        Status of service:  Completed, signed off Medicare Important Message given?  NO (If response is "NO", the following Medicare IM given date fields will be blank) Date Medicare IM given:   Medicare IM given by:   Date Additional Medicare IM given:   Additional Medicare IM given by:    Discharge Disposition:  HOME/SELF CARE  Per UR Regulation:  Reviewed for med. necessity/level of care/duration of stay  If discussed at [place] of Stay Meetings, dates discussed:    Comments:  11/21/14 Ellan Lambert, RN, BSN [phone] Pt with low EF of 35%; will need Lifevest.  Plan for [employer] rep to fit pt with Lifevest this evening, in preparation for possible dc later today or tomorrow.  Verified this with Kerrin Mo of [facility].  [phone])  11/19/14 Ellan Lambert, RN, BSN [phone] Pt here from [place] visiting his best friend for "maybe a week."  He states he has access to a PCP in [place], and plans to follow up with him upon return to [place].  He has been noncompliant with meds, as he has chosen not to go to see a PCP, and had no physician to write Rx for him.  He realizes that this was not a good idea, and that he needs to take better care of himself.  He states he can afford follow up at his PCP; currently receives  retirement benefit check monthly.  He states his meds won't lilkely be an issue as long as they are mostly generic.  Inhalers may be an issue.  Pt is eligible for assistance through [employer] letter if needed--may need for inhalers until he can get appt for F/U with his [facility] PCP.  Pt realizes that the decision to seek healthcare follow up is his choice, and seems ready to start making some positive changes.

## 2014-11-19 NOTE — Progress Notes (Signed)
BP 163/98 and HR 100. MD notified. New orders given. Will continue to monitor patient for further changes in condition.

## 2014-11-19 NOTE — Progress Notes (Signed)
Nutrition Brief Note  Patient identified on the Malnutrition Screening Tool (MST) Report  Wt Readings from Last 15 Encounters:  11/19/14 335 lb 4.8 oz (152.091 kg)    Body mass index is 52.5 kg/(m^2). Patient meets criteria for Morbid Obesity based on current BMI.   Current diet order is Carb Modified/Heart Healthy, patient is consuming approximately 75-100% of meals at this time. Pt reports following a diabetic diet PTA. He denies any weight loss and reports eating normally PTA. He denies any nutrition questions or concerns at this time.  Labs and medications reviewed.   No nutrition interventions warranted at this time. If nutrition issues arise, please consult RD.   Ian Malkineanne Barnett RD, LDN Inpatient Clinical Dietitian Pager: [phone] After Hours Pager: [phone]

## 2014-11-19 NOTE — Progress Notes (Signed)
*  PRELIMINARY RESULTS* Echocardiogram 2D Echocardiogram has been performed.  Tyler Hays, Tyler Hays 11/19/2014, 11:30 AM

## 2014-11-20 DIAGNOSIS — E11319 Type 2 diabetes mellitus with unspecified diabetic retinopathy without macular edema: Secondary | ICD-10-CM

## 2014-11-20 DIAGNOSIS — I5023 Acute on chronic systolic (congestive) heart failure: Principal | ICD-10-CM

## 2014-11-20 LAB — CBC
HEMATOCRIT: 40.3 % (ref 39.0–52.0)
HEMOGLOBIN: 12.9 g/dL — AB (ref 13.0–17.0)
MCH: 25.6 pg — AB (ref 26.0–34.0)
MCHC: 32 g/dL (ref 30.0–36.0)
MCV: 80.1 fL (ref 78.0–100.0)
PLATELETS: 298 10*3/uL (ref 150–400)
RBC: 5.03 MIL/uL (ref 4.22–5.81)
RDW: 15.4 % (ref 11.5–15.5)
WBC: 6.8 10*3/uL (ref 4.0–10.5)

## 2014-11-20 LAB — BASIC METABOLIC PANEL
ANION GAP: 6 (ref 5–15)
BUN: 24 mg/dL — ABNORMAL HIGH (ref 6–23)
CHLORIDE: 101 meq/L (ref 96–112)
CO2: 33 mmol/L — ABNORMAL HIGH (ref 19–32)
CREATININE: 1.53 mg/dL — AB (ref 0.50–1.35)
Calcium: 9.1 mg/dL (ref 8.4–10.5)
GFR calc Af Amer: 59 mL/min — ABNORMAL LOW (ref >90)
GFR calc non Af Amer: 51 mL/min — ABNORMAL LOW (ref >90)
Glucose, Bld: 141 mg/dL — ABNORMAL HIGH (ref 70–99)
Potassium: 3.2 mmol/L — ABNORMAL LOW (ref 3.5–5.1)
Sodium: 140 mmol/L (ref 135–145)

## 2014-11-20 LAB — GLUCOSE, CAPILLARY
Glucose-Capillary: 142 mg/dL — ABNORMAL HIGH (ref 70–99)
Glucose-Capillary: 137 mg/dL — ABNORMAL HIGH (ref 70–99)
Glucose-Capillary: 211 mg/dL — ABNORMAL HIGH (ref 70–99)
Glucose-Capillary: 135 mg/dL — ABNORMAL HIGH (ref 70–99)

## 2014-11-20 MED ORDER — LISINOPRIL 10 MG PO TABS
10.0000 mg | ORAL_TABLET | Freq: Every day | ORAL | Status: DC
  Administered 2014-11-20 – 2014-11-21 (×2): 10 mg via ORAL
  Filled 2014-11-20 (×2): qty 1

## 2014-11-20 MED ORDER — HYDRALAZINE HCL 20 MG/ML IJ SOLN: 10.0000 mg | INTRAMUSCULAR | Status: DC | PRN

## 2014-11-20 MED ORDER — POTASSIUM CHLORIDE CRYS ER 20 MEQ PO TBCR
40.0000 meq | EXTENDED_RELEASE_TABLET | Freq: Four times a day (QID) | ORAL | Status: AC
  Administered 2014-11-20 (×2): 40 meq via ORAL
  Filled 2014-11-20 (×2): qty 2

## 2014-11-20 MED ORDER — CARVEDILOL 12.5 MG PO TABS
12.5000 mg | ORAL_TABLET | Freq: Two times a day (BID) | ORAL | Status: DC
Start: 2014-11-20 — End: 2014-11-20
  Administered 2014-11-20: 12.5 mg via ORAL
  Filled 2014-11-20 (×3): qty 1

## 2014-11-20 MED ORDER — CARVEDILOL 25 MG PO TABS
25.0000 mg | ORAL_TABLET | Freq: Two times a day (BID) | ORAL | Status: DC
  Administered 2014-11-20 – 2014-11-21 (×3): 25 mg via ORAL
  Filled 2014-11-20 (×5): qty 1

## 2014-11-20 NOTE — Progress Notes (Addendum)
PROGRESS NOTE  Tyler Hays WRU:[id number] DOB: [birth year] DOA: 11/18/2014 PCP: No PCP Per Patient  HPI/Subjective: Tyler Hays is a 54 yo Caucasian male who presented to the ED with SOB and was admitted 1/5. Pt believes SOB is d/t to asthma returning from childhood, though, on suggestion, admits fearing SOB is d/t his heart.  We learned today that the pt has seen cardiologists in the past who have told him his EF was around 35% and suggested an automatic implantable cardioverter defibrillator (AICD).  Continuity of care has been difficult d/t pt's worker's compensation case and his self-admitted negligence in finding another PCP or cardiologist.  Pt used CPAP last night (nose mask) without problems.  ROS: Gen: Patient is very tired d/t little sleep last night d/t usual headaches. Headache is now down to a 1/10 after Tylenol. Pulm: Pt has occasional cough (every few hours) with wheezing quality.  CV: Denies palpitations or sensation of heart racing. Reports new orthopnea last night (denied orthopnea yesterday), but no PND. Inhaler helps. GU: Pt noted "my kidneys hurt today" like a dull throb and hematuria this morning. Last incident was 4 days ago. Pt reports hx of kidney stones. Pt reports urinating a lot d/t Lasix. Extremities: Pt notes that his feet seem bigger than normal. While walking pt down the hall, pt reported feeling fine and that normally at home he would have had to sit and rest 3 times to walk the same distance.   Assessment/Plan:  1. Acute on Chronic Systolic Congestive Heart Failure Probably d/t not taking meds for the past several months nor f/u with PCP. Stage IIIC d/t 30-35% EF observed on echo, and self-report from patient on tiring from walking around his home. Last 24 hours, fluid net negative balance 2.857 L.  -Continue with IV Lasix. -Stabilize, supply with heart medications to take home upon discharge. -Encourage establishing PCP and cardiologist at home.  2.  Reactive Airway Disease Asthma-like sx d/t heart failure and pulmonary fluid build-up. -Continue with Duoneb, Dulera, and Albuterol.  3. Headaches Usual headaches patient has had since childhood; often d/t chiari malformation. -Continue with Tylenol.  4. Hypertension This morning, BP higher than before at 175/110 at 0616.  -Increase Carvedilol (Coreg) to 25 mg po BID. -Increase Lisinopril from 5 mg po QD to 10 mg QD. -Hydralazine 10 mg IV PRN if SBP > 160 mg. -Target BP < 120/90. -Monitor throughout the day.  5. Non-compliance with Meds and Physician Follow-up Pt realizes his responsibility in finding a PCP and cardiologist upon return home to [place]. Tyler Hays insists on helping set up appointment. We explained to pt the risk of repeat fluid overload and sudden cardiac death; we encouraged finding a cardiologist, compliance with heart medications, and getting an AICD. Pt seems amenable to making changes.  6. Type 2 diabetes mellitus Blood sugar remains controlled. This morning 142 and 137. -Continue with regular checks.  7. Hypokalemia Potassium level at 3.2. Probably d/t Lasix. -Ordered K-DUR 40 mEq. -Re-check in the a.m.  DVT Prophylaxis:  Heparin injection 5,000 units, [facility], Q8H.  Code Status: Full code Family Communication: Tyler Hays is in the room with him today and says he will push Tyler Hays to get a cardiologist (that Caryn Bee knows) in [place].  Disposition Plan: D/c to home in next 24 hours. (Upon d/c, pt plans to drive back to [place] with a friend in the next 2-3 days.)  Procedures: -Transthoracic echocardiogram 11/19/2013 demonstrated ejection fraction 30-35%.  Antibiotics:  None.  Objective:  Filed Vitals:   11/20/14 0308 11/20/14 0616 11/20/14 0625 11/20/14 0955  BP: 159/96 175/110  147/104  Pulse: 101 103 103 105  Temp: 97.9 F (36.6 C)  97.8 F (36.6 C)   TempSrc: Oral  Oral   Resp: 20  20   Height:      Weight:   152 kg (335 lb 1.6 oz)     SpO2: 97%  98%     Intake/Output Summary (Last 24 hours) at 11/20/14 1305 Last data filed at 11/20/14 1100  Gross per 24 hour  Intake   1203 ml  Output   3600 ml  Net  -2397 ml   Filed Weights   11/18/14 1937 11/19/14 0524 11/20/14 0625  Weight: 155.947 kg (343 lb 12.8 oz) 152.091 kg (335 lb 4.8 oz) 152 kg (335 lb 1.6 oz)    Exam: Vitals: 105 bpm on routine measuring; HR 88 bpm when I measured manually at beginning and end of exam. BP increased to 175/110 at 0616. (At administration of Lisinopril and Coreg, decreased to 147/104 at 0955; further decreased to 140/98 at 1311.) General: Well developed, obese, appears stated age. Appears very tired but remains pleasant and cooperative.  HEENT:  PERR, EOMI, Anicteic Sclera, MMM. No pharyngeal erythema or exudates  Neck: Supple, no JVD, no masses  Cardiovascular: RRR, S1 S2 auscultated, no m/r/g; diminished heart sounds d/t body habitus. Temporal pulses 3+, no temporal bruits. Radial pulses 3+ and equal.    Respiratory: Diminished breath sounds d/t body habitus.   Abdomen: Soft, nontender, nondistended, + bowel sounds  Extremities:  Dry. Pitting edema 2+ or 3+ up to calves b/l. No cyanosis.   Neuro: AAOx3, light sensation intact in feet b/l.   Skin: Legs have numerous scratches and scabs (from pt's pet cat).   Psych: Normal affect and demeanor with intact judgement and insight.  Patient walked down the hall with no assistance; O2Sat remained 96% and above, HR 105-110 bpm (close to measured resting HR).  Data Reviewed: Basic Metabolic Panel:  Recent Labs Lab 11/18/14 1257 11/19/14 0111 11/20/14 0509  NA 140 137 140  K 3.4* 3.8 3.2*  CL 103 98 101  CO2 26 27 33*  GLUCOSE 132* 259* 141*  BUN 14 20 24*  CREATININE 1.40* 1.63* 1.53*  CALCIUM 9.1 9.4 9.1   Liver Function Tests: No results for input(s): AST, ALT, ALKPHOS, BILITOT, PROT, ALBUMIN in the last 168 hours. No results for input(s): LIPASE, AMYLASE in the last 168  hours. No results for input(s): AMMONIA in the last 168 hours. CBC:  Recent Labs Lab 11/18/14 1257 11/20/14 0509  WBC 8.2 6.8  HGB 12.8* 12.9*  HCT 40.0 40.3  MCV 79.4 80.1  PLT 296 298   Cardiac Enzymes:  Recent Labs Lab 11/18/14 2041 11/19/14 0111 11/19/14 0731  TROPONINI 0.05* 0.05* 0.06*   BNP (last 3 results) No results for input(s): PROBNP in the last 8760 hours. CBG:  Recent Labs Lab 11/19/14 1154 11/19/14 1652 11/19/14 2113 11/20/14 0618 11/20/14 1134  GLUCAP 145* 133* 206* 142* 137*    No results found for this or any previous visit (from the past 240 hour(s)).   Studies: Dg Chest 2 View  11/18/2014   CLINICAL DATA:  Short of breath  EXAM: CHEST  2 VIEW  COMPARISON:  None.  FINDINGS: Cardiac enlargement. Negative for heart failure or pneumonia. Mild right lower lobe atelectasis. Negative for pleural effusion.  IMPRESSION: Cardiac enlargement without heart failure. Mild right lower lobe atelectasis.  Electronically Signed   By: Marlan Palau M.D.   On: 11/18/2014 14:03   Ct Angio Chest Pe W/cm &/or Wo Cm  11/18/2014   CLINICAL DATA:  Shortness of breath.  History of cardiomyopathy.  EXAM: CT ANGIOGRAPHY CHEST WITH CONTRAST  TECHNIQUE: Multidetector CT imaging of the chest was performed using the standard protocol during bolus administration of intravenous contrast. Multiplanar CT image reconstructions and MIPs were obtained to evaluate the vascular anatomy.  CONTRAST:  OMNIPAQUE IOHEXOL 350 MG/ML SOLN  COMPARISON:  Radiographs earlier the same day.  FINDINGS: Mild-moderate patient 3 motion artifact. Allowing for this, there are no filling defects in the pulmonary arteries to suggest pulmonary embolus. The thoracic aorta is normal in caliber.  There is multi chamber cardiomegaly with contrast refluxing into the IVC and hepatic veins. Small bilateral pleural effusions. There is no pericardial effusion. There is mild mediastinal and bilateral hilar adenopathy,  likely reactive.  Lung parenchymal evaluation limited due to breathing motion, there is questionable mild ground-glass opacity diffusely. Linear atelectasis in the periphery of the right upper lobe. No confluent airspace disease to suggest pneumonia. No pulmonary mass.  Evaluation of the upper abdomen demonstrates fatty atrophy of the included pancreas. There is hepatic steatosis.  Degenerative change in the mid thoracic spine. There is no acute or suspicious osseous abnormality.  Review of the MIP images confirms the above findings.  IMPRESSION: 1. Motion limited exam, no evidence pulmonary embolus. 2. Cardiomegaly, small bilateral pleural effusions and contrast refluxing into the hepatic vein and IVC, suggestive of heart failure. Questionable ground-glass opacities may reflect pulmonary edema. 3. Mild mediastinal and bilateral hilar adenopathy, this is likely reactive.   Electronically Signed   By: Rubye Oaks M.D.   On: 11/18/2014 17:19    Scheduled Meds: . aspirin EC  325 mg Oral Daily  . carvedilol  12.5 mg Oral BID WC  . furosemide  40 mg Intravenous Daily  . heparin  5,000 Units Subcutaneous 3 times per day  . insulin aspart  0-20 Units Subcutaneous TID WC  . lisinopril  10 mg Oral Daily  . mometasone-formoterol  2 puff Inhalation BID  . sodium chloride  3 mL Intravenous Q12H   Continuous Infusions:   Active Problems:   Hypertension   Type 2 diabetes mellitus   Systolic heart failure  Time Spent: 35 min    [facility] Pager [phone]. If 7PM-7AM, please contact night-coverage at [email or web address], password [facility] 11/20/2014, 1:05 PM  LOS: 2 days     Addendum  I personally evaluated Mr Yohn on 11/20/2014 and agree with the above findings. He is a 54 year old gentleman with a past medical history of systolic congestive heart failure, having an ejection fraction of 35%, who had been off his medications for the past month percent clinical signs symptoms consistent with acute  decompensated congestive heart failure. He was started on IV Lasix at 40 mg daily having good urinary output. He has a net negative fluid balance of 8.34, with urine output of 3.7 L in the past 24 hours. On exam he remains fluid overloaded, having 2+ bilateral extremity pitting edema. Blood pressures remain elevated with systolic blood pressures in the 170s for which I increased his Coreg to 25 mg by mouth twice a day. Remains on Lasix 40 mg by mouth daily. Transthoracic echocardiogram during this hospitalization showing ejection fraction of 35%. He has been off of medical therapy for the past 4 months. We discussed at length with him the importance of  remaining compliant to medications. He would benefit from a Life Vest since he is an increased risk of cardiac death. I spoke with he Cardiologist's office in [place], patient having a history of nonischemic cardiomyopathy, last cath in 2014. They will be calling him within a week to set up a follow up appointment.

## 2014-11-20 NOTE — Progress Notes (Signed)
Patient has a CPAP machine set up at bedside, ready to use as needed. I put sterile water in the chamber and set the humidity to 2 per patient request. CPAP is on a setting of 12 per patient request. Patient states he can go on and off CPAP machine as needed. Patient is aware to call at any time if he does need any more assistance with the machine. RT will continue to assist as needed.

## 2014-11-20 NOTE — Plan of Care (Signed)
Problem: Phase I Progression Outcomes Goal: EF % per last Echo/documented,Core Reminder form on chart Outcome: Completed/Met Date Met:  11/20/14 Echo performed on 11/19/2014 EF% result - 30-35%

## 2014-11-21 DIAGNOSIS — I5023 Acute on chronic systolic (congestive) heart failure: Secondary | ICD-10-CM | POA: Insufficient documentation

## 2014-11-21 DIAGNOSIS — E876 Hypokalemia: Secondary | ICD-10-CM | POA: Diagnosis present

## 2014-11-21 DIAGNOSIS — Z91199 Patient's noncompliance with other medical treatment and regimen due to unspecified reason: Secondary | ICD-10-CM

## 2014-11-21 DIAGNOSIS — Z9119 Patient's noncompliance with other medical treatment and regimen: Secondary | ICD-10-CM

## 2014-11-21 LAB — URINALYSIS, ROUTINE W REFLEX MICROSCOPIC
Bilirubin Urine: NEGATIVE
GLUCOSE, UA: NEGATIVE mg/dL
Hgb urine dipstick: NEGATIVE
Ketones, ur: NEGATIVE mg/dL
Leukocytes, UA: NEGATIVE
Nitrite: NEGATIVE
PROTEIN: 100 mg/dL — AB
Specific Gravity, Urine: 1.027 (ref 1.005–1.030)
UROBILINOGEN UA: 1 mg/dL (ref 0.0–1.0)
pH: 5.5 (ref 5.0–8.0)

## 2014-11-21 LAB — URINE MICROSCOPIC-ADD ON

## 2014-11-21 LAB — BASIC METABOLIC PANEL
ANION GAP: 10 (ref 5–15)
BUN: 28 mg/dL — AB (ref 6–23)
CALCIUM: 8.9 mg/dL (ref 8.4–10.5)
CO2: 26 mmol/L (ref 19–32)
CREATININE: 1.57 mg/dL — AB (ref 0.50–1.35)
Chloride: 101 mEq/L (ref 96–112)
GFR, EST AFRICAN AMERICAN: 57 mL/min — AB (ref >90)
GFR, EST NON AFRICAN AMERICAN: 49 mL/min — AB (ref >90)
Glucose, Bld: 131 mg/dL — ABNORMAL HIGH (ref 70–99)
POTASSIUM: 3.6 mmol/L (ref 3.5–5.1)
SODIUM: 137 mmol/L (ref 135–145)

## 2014-11-21 LAB — GLUCOSE, CAPILLARY
Glucose-Capillary: 126 mg/dL — ABNORMAL HIGH (ref 70–99)
Glucose-Capillary: 133 mg/dL — ABNORMAL HIGH (ref 70–99)
Glucose-Capillary: 173 mg/dL — ABNORMAL HIGH (ref 70–99)

## 2014-11-21 LAB — CBC
HCT: 38.8 % — ABNORMAL LOW (ref 39.0–52.0)
HEMOGLOBIN: 12.3 g/dL — AB (ref 13.0–17.0)
MCH: 25.5 pg — AB (ref 26.0–34.0)
MCHC: 31.7 g/dL (ref 30.0–36.0)
MCV: 80.5 fL (ref 78.0–100.0)
PLATELETS: 287 10*3/uL (ref 150–400)
RBC: 4.82 MIL/uL (ref 4.22–5.81)
RDW: 15.2 % (ref 11.5–15.5)
WBC: 6.1 10*3/uL (ref 4.0–10.5)

## 2014-11-21 MED ORDER — ASPIRIN EC 81 MG PO TBEC: 81.0000 mg | DELAYED_RELEASE_TABLET | Freq: Every day | ORAL | Status: AC

## 2014-11-21 MED ORDER — LISINOPRIL 10 MG PO TABS: 10.0000 mg | ORAL_TABLET | Freq: Every day | ORAL | Status: AC

## 2014-11-21 MED ORDER — SPIRONOLACTONE 25 MG PO TABS: 25.0000 mg | ORAL_TABLET | Freq: Every day | ORAL | Status: AC

## 2014-11-21 MED ORDER — FUROSEMIDE 40 MG PO TABS
60.0000 mg | ORAL_TABLET | Freq: Every day | ORAL | Status: DC
  Administered 2014-11-21: 60 mg via ORAL
  Filled 2014-11-21: qty 1

## 2014-11-21 MED ORDER — MOMETASONE FURO-FORMOTEROL FUM 100-5 MCG/ACT IN AERO: 2.0000 | INHALATION_SPRAY | Freq: Two times a day (BID) | RESPIRATORY_TRACT | Status: AC

## 2014-11-21 MED ORDER — FUROSEMIDE 20 MG PO TABS: 40.0000 mg | ORAL_TABLET | Freq: Every day | ORAL | Status: AC

## 2014-11-21 MED ORDER — CARVEDILOL 25 MG PO TABS: 25.0000 mg | ORAL_TABLET | Freq: Two times a day (BID) | ORAL | Status: AC

## 2014-11-21 MED ORDER — ASPIRIN 325 MG PO TBEC: 325.0000 mg | DELAYED_RELEASE_TABLET | Freq: Every day | ORAL | Status: DC

## 2014-11-21 MED ORDER — SPIRONOLACTONE 25 MG PO TABS
25.0000 mg | ORAL_TABLET | Freq: Every day | ORAL | Status: DC
  Administered 2014-11-21: 25 mg via ORAL
  Filled 2014-11-21: qty 1

## 2014-11-21 NOTE — Progress Notes (Signed)
DC IV, DC Tele, DC Home discharge instructions and home medications discussed with patient and patient's friend. Patient denied any questions or concerns at this time. Patient has been fitted for [employer] and and educated as well by [employer] team member. Patient leaving unit via wheel chair and appears in no acute distress.

## 2014-11-21 NOTE — Discharge Summary (Signed)
Physician Discharge Summary  Tyler Hays IHK:[id number] DOB: [birth year] DOA: 11/18/2014  PCP: No PCP Per Patient  Admit date: 11/18/2014 Discharge date: 11/21/2014  Time spent: 30 minutes  Recommendations for Outpatient Follow-up:  1. Please follow up on BMP on hospital follow up appointment, on 11/21/2014 he was at his baseline creatinine at 1.5. He was treated for acute CHF and was discharged on diuretic therapy.m 2. Pt has been counseled numerous times on establishing a cardiologist upon return to [place] for management of congestive heart failure. 3. Pt should faithfully continue medical therapy: Furosemide (Lasix) 40 mg po QD, Carvedilol (Coreg) 25 mg po BID WC, Lisinopril (Prinivil, Zestril) 10 mg po QD, Spironolactone (Aldactone) 25 mg po QD. 4. Pt receiving Life Vest here; use Life Vest due to risk of arrhythmia and sudden cardiac death. Echo showing EF of 30-35%, having history of nonischemic cardiomyopathy.  6.   Consider automatic implantable cardioverter defibrillator in the future if compliant with medical therapy.    Discharge Diagnoses:  Principal Problem:   NYHA class 3 acute on chronic systolic heart failure Active Problems:   Hypertension   Type 2 diabetes mellitus   Hypokalemia   Noncompliance   Discharge Condition: Stable  Diet recommendation: Fluid-restricted, low-salt.   Filed Weights   11/19/14 0524 11/20/14 0625 11/21/14 0629  Weight: 152.091 kg (335 lb 4.8 oz) 152 kg (335 lb 1.6 oz) 152.5 kg (336 lb 3.2 oz)    History of present illness:  Tyler Hays is a 54 y.o. male presenting to the ED with shortness of breath. He states that his asthma has been acting up since Thanksgiving. Patient states that he has not been taking any of his medications. Patient states he has not taken meds for 6 months. Apparently was in a workman comp case and has not gotten any meds. He states that he has a history of cardiomyopathy due to hypertension. Patient states that he also  has a chiari malformation. Patient states he does not smoke. He does not drink regularly. Patient states he has noted his legs are more swollen. He has cat scratches on his legs. He is also a diabetic and is not taking his medications. He was on metformin. He states that he was taking over the counter medications for asthma. He is also on CPAP for OSA at a pressure of 12CWP.   Hospital Course:  Tyler Hays is a 54 yo Caucasian male who presented to the ED with SOB and was admitted 11/18/2014. Pt believes SOB is d/t to asthma returning from childhood, though, on suggestion, admits fearing SOB is d/t his heart. We learned that the pt has seen cardiologists in the past who have told him his EF was around 35% and suggested an automatic implantable cardioverter defibrillator (AICD). Continuity of care has been difficult d/t pt's worker's compensation case and his self-admitted negligence in finding another PCP or cardiologist. Pt has several good friends back home and seems amenable to making changes.  2d echocardiogram 11/19/2014 showed EF 30-35%. Pt has diabetes mellitus, chiari malformation, hypertension, class IIIC congestive heart failure.    During his hospital stay, he used a CPAP for 2 night without problems. Patient was slightly hypokalemic d/t Lasix, but at discharge (11/21/2014) potassium is 3.6.  HPI ROS 11/21/2014: General: Patient slept better last night. Pulm: No SOB sitting at rest. Reports still some orthopnea. GU: Still reports hematuria (amber color). No burning or urgency.  1. Acute on Chronic Systolic Congestive Heart Failure -Probably d/t not taking  meds for the past several months nor f/u with PCP. Stage IIIC d/t 30-35% EF observed on 2D transthoracic echo, and self-report from patient on tiring from walking around his home.  -Presentation to ER 11/18/2014, weight 159.2 kg (350.27 lbs). On discharge 11/21/2014, weight 152.5 kg (335.5 lbs).  -On discharge had net negative fluid  balance -7.987 L.   Plan:  -Furosemide (Lasix) 60 mg po QD, Carvedilol (Coreg) 25 mg po BID WC, Lisinopril (Prinivil, Zestril) 10 mg po QD, Spironolactone (Aldactone) 25 mg po QD. -Life Vest (received during this admission)  2. Reactive Airway Disease -Likely secondary to pulmonary edema -resolved  3. Headaches -Usual headaches patient has had since childhood; often d/t chiari malformation. Htn may have contributed as well. -Plan: Continue with Tylenol.  4. Hypertension -Likely due to lifestyle habits, body habitus, and non-compliance.  -During hospital stay, BP reached 175/110 (on 11/20/2014). To reach target BP < 120/90, we increased Carvedilol (Coreg) and Lisinopril (Prinivil); past 24 hours BP ranged from 152/93 to 115/81.   -On discharge (11/21/2014), BP 122/55.  Plan: Continue with meds as prescribed above for heart failure.  5. Sleep apnea -Probably d/t body habitus and CHF. -Plan: Use CPAP at home (pt already has machine at home)  6. Type 2 diabetes mellitus Blood sugar remains somewhat controlled. This morning 142 and 137. Plan: -Diet-controlled. Please f/u with PCP on blood sugars.   7. Non-compliance with Meds and Physician Follow-up -Pt realizes his responsibility in finding a PCP and cardiologist upon return home to [place]. Tyler Hays insists on helping set up appointment. -Talked to his [name] cardiologist, but pt may have to find a new cardiologist d/t workman's compensation rules. -We explained to pt the risk of repeat fluid overload and sudden cardiac death; we encouraged finding a cardiologist, compliance with heart medications, wearing Life Vest, and eventually getting an AICD. Pt seems amenable to making changes.  Procedures:  11/19/2014: 2D transthoracic echocardiogram with contrast Left ventricle: The cavity size was normal. Wall thickness was increased in a pattern of moderate LVH. There was concentric hypertrophy. Systolic function was moderately to  severely reduced. The estimated ejection fraction was in the range of 30% to 35%. Images were inadequate for LV wall motion assessment, even after Definity administration. Due to tachycardia, there was fusion of early and atrial contributions to ventricular filling. The study is not technically sufficient to allow evaluation of LV diastolic function.  Consultations:  Life Vest Anell Barr with [employer])  Discharge Exam: Filed Vitals:   11/21/14 1420  BP: 122/55  Pulse: 100  Temp: 98.2 F (36.8 C)  Resp: 18   Exam:  General: Well-developed.  HEENT: No lymphadenopathy, masses, or tenderness. Cardiovascular: S1 S2 heard, no m/r/g, sounds diminished d/t body habitus. Temporal and radial pulses 3+ b/l.  Respiratory: Slight wheeze lower lobes; elsewhere CTAB. Normal respiratory effort.  Extremities: Dry and warm. 2+ pitting edema; down from yesterday.    Discharge Instructions   Discharge Instructions    (HEART FAILURE PATIENTS) Call MD:  Anytime you have any of the following symptoms: 1) 3 pound weight gain in 24 hours or 5 pounds in 1 week 2) shortness of breath, with or without a dry hacking cough 3) swelling in the hands, feet or stomach 4) if you have to sleep on extra pillows at night in order to breathe.    Complete by:  As directed      Call MD for:  difficulty breathing, headache or visual disturbances  Complete by:  As directed      Call MD for:  extreme fatigue    Complete by:  As directed      Call MD for:  hives    Complete by:  As directed      Call MD for:  persistant dizziness or light-headedness    Complete by:  As directed      Call MD for:  persistant nausea and vomiting    Complete by:  As directed      Call MD for:  redness, tenderness, or signs of infection (pain, swelling, redness, odor or green/yellow discharge around incision site)    Complete by:  As directed      Call MD for:  severe uncontrolled pain    Complete by:  As directed       Call MD for:  temperature >100.4    Complete by:  As directed      Diet - low sodium heart healthy    Complete by:  As directed      Discharge instructions    Complete by:  As directed   Please follow up with your cardiologist and PCP within 1 [place] [facility]     Increase activity slowly    Complete by:  As directed           Current Discharge Medication List    START taking these medications   Details  aspirin EC 81 MG tablet Take 1 tablet (81 mg total) by mouth daily. Qty: 30 tablet, Refills: 1    carvedilol (COREG) 25 MG tablet Take 1 tablet (25 mg total) by mouth 2 (two) times daily with a meal. Qty: 30 tablet, Refills: 1    furosemide (LASIX) 20 MG tablet Take 2 tablets (40 mg total) by mouth daily. Qty: 30 tablet, Refills: 1    lisinopril (PRINIVIL,ZESTRIL) 10 MG tablet Take 1 tablet (10 mg total) by mouth daily. Qty: 30 tablet, Refills: 1    mometasone-formoterol (DULERA) 100-5 MCG/ACT AERO Inhale 2 puffs into the lungs 2 (two) times daily. Qty: 1 Inhaler, Refills: 1    spironolactone (ALDACTONE) 25 MG tablet Take 1 tablet (25 mg total) by mouth daily. Qty: 30 tablet, Refills: 1       Allergies  Allergen Reactions  . Tramadol Nausea And Vomiting   Follow-up Information    Follow up with [facility]    .   Why:  To establish care.   Contact information:   [address] [place] [id number] [phone]       The results of significant diagnostics from this hospitalization (including imaging, microbiology, ancillary and laboratory) are listed below for reference.    Significant Diagnostic Studies: Dg Chest 2 View  11/18/2014   CLINICAL DATA:  Short of breath  EXAM: CHEST  2 VIEW  COMPARISON:  None.  FINDINGS: Cardiac enlargement. Negative for heart failure or pneumonia. Mild right lower lobe atelectasis. Negative for pleural effusion.  IMPRESSION: Cardiac enlargement without heart  failure. Mild right lower lobe atelectasis.   Electronically Signed   By: Marlan Palauharles  Clark M.D.   On: 11/18/2014 14:03   Ct Angio Chest Pe W/cm &/or Wo Cm  11/18/2014   CLINICAL DATA:  Shortness of breath.  History of cardiomyopathy.  EXAM: CT ANGIOGRAPHY CHEST WITH CONTRAST  TECHNIQUE: Multidetector CT imaging of the chest was performed using the standard protocol during bolus administration of intravenous contrast. Multiplanar CT image  reconstructions and MIPs were obtained to evaluate the vascular anatomy.  CONTRAST:  OMNIPAQUE IOHEXOL 350 MG/ML SOLN  COMPARISON:  Radiographs earlier the same day.  FINDINGS: Mild-moderate patient 3 motion artifact. Allowing for this, there are no filling defects in the pulmonary arteries to suggest pulmonary embolus. The thoracic aorta is normal in caliber.  There is multi chamber cardiomegaly with contrast refluxing into the IVC and hepatic veins. Small bilateral pleural effusions. There is no pericardial effusion. There is mild mediastinal and bilateral hilar adenopathy, likely reactive.  Lung parenchymal evaluation limited due to breathing motion, there is questionable mild ground-glass opacity diffusely. Linear atelectasis in the periphery of the right upper lobe. No confluent airspace disease to suggest pneumonia. No pulmonary mass.  Evaluation of the upper abdomen demonstrates fatty atrophy of the included pancreas. There is hepatic steatosis.  Degenerative change in the mid thoracic spine. There is no acute or suspicious osseous abnormality.  Review of the MIP images confirms the above findings.  IMPRESSION: 1. Motion limited exam, no evidence pulmonary embolus. 2. Cardiomegaly, small bilateral pleural effusions and contrast refluxing into the hepatic vein and IVC, suggestive of heart failure. Questionable ground-glass opacities may reflect pulmonary edema. 3. Mild mediastinal and bilateral hilar adenopathy, this is likely reactive.   Electronically Signed   By:  Rubye Oaks M.D.   On: 11/18/2014 17:19    Microbiology: No results found for this or any previous visit (from the past 240 hour(s)).   Labs: Basic Metabolic Panel:  Recent Labs Lab 11/18/14 1257 11/19/14 0111 11/20/14 0509 11/21/14 0517  NA 140 137 140 137  K 3.4* 3.8 3.2* 3.6  CL 103 98 101 101  CO2 26 27 33* 26  GLUCOSE 132* 259* 141* 131*  BUN 14 20 24* 28*  CREATININE 1.40* 1.63* 1.53* 1.57*  CALCIUM 9.1 9.4 9.1 8.9   Liver Function Tests: No results for input(s): AST, ALT, ALKPHOS, BILITOT, PROT, ALBUMIN in the last 168 hours. No results for input(s): LIPASE, AMYLASE in the last 168 hours. No results for input(s): AMMONIA in the last 168 hours. CBC:  Recent Labs Lab 11/18/14 1257 11/20/14 0509 11/21/14 0517  WBC 8.2 6.8 6.1  HGB 12.8* 12.9* 12.3*  HCT 40.0 40.3 38.8*  MCV 79.4 80.1 80.5  PLT 296 298 287   Cardiac Enzymes:  Recent Labs Lab 11/18/14 2041 11/19/14 0111 11/19/14 0731  TROPONINI 0.05* 0.05* 0.06*   BNP: BNP (last 3 results) No results for input(s): PROBNP in the last 8760 hours. CBG:  Recent Labs Lab 11/20/14 1134 11/20/14 1619 11/20/14 2111 11/21/14 0605 11/21/14 1147  GLUCAP 137* 211* 135* 126* 133*       Signed:  Eliot Popper, PA-S  [facility] 11/21/2014, 4:29 PM   Addendum I personally evaluated patient on 11/21/2014 and agree with the above findings. Patient is a pleasant 54 year old gentleman with a past medical history of nonischemic cardiomyopathy having an ejection fraction of 30-35%, admitted to the medicine service on 11/18/2014 when he presented with complaints of shortness of breath, bilateral extremity edema and weight gain. He reported being off of his medications for the past 4 months as he had not followed up with his primary care physician or cardiologist. Symptoms felt to be secondary to acute decompensated congestive heart failure for which she was initially treated with Lasix 40 mg IV  daily. During this hospitalization he had a transthoracic echocardiogram that showed an ejection fraction of 30-35%. He was hypertensive during this hospitalization and started on  Coreg and lisinopril. These medications were titrated up for better blood pressure management. Overall he showed good response to IV diuresis having a net negative fluid balance of nearly 8 L on discharge. Furthermore his weight came down by 15 pounds. Having ejection fraction of 30-35% not being on optimal or any medical therapy in the last 4 months, I felt he would benefit from LifeVest given risk of cardiac death. He was discharged to his home in stable condition on 11/21/2014. He plans to travel back to [place] in the next several days. I encouraged him to follow-up with his PCP and cardiologist as to see arrived. Will need repeat BMP on his follow-up visit. I called his cardiologist's office in [place] who told me they would follow up with him within a week.

## 2016-05-16 IMAGING — CT CT ANGIO CHEST
2 of 8 series · 17 of 46 positions shown · IV contrast (Omni 300)
Comparison: Radiographs earlier the same day.

CLINICAL DATA: Shortness of breath.  History of cardiomyopathy.

EXAM:
CT ANGIOGRAPHY CHEST WITH CONTRAST
TECHNIQUE: Multidetector CT imaging of the chest was performed using the
standard protocol during bolus administration of intravenous
contrast. Multiplanar CT image reconstructions and MIPs were
obtained to evaluate the vascular anatomy.
CONTRAST:  100mL OMNIPAQUE IOHEXOL 350 MG/ML SOLN

[Series 10: thins · axial · 0.98mm/px · z∈[+1340,+1630]mm · 14 of 320 slices shown]
[im 15/320  lung]
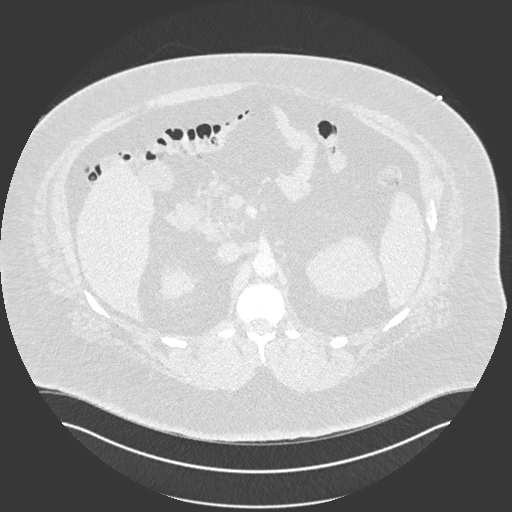
[im 44/320  soft-tissue]
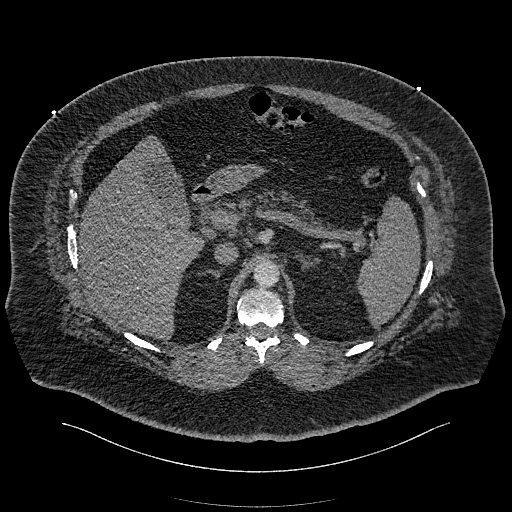
[im 59/320  lung]
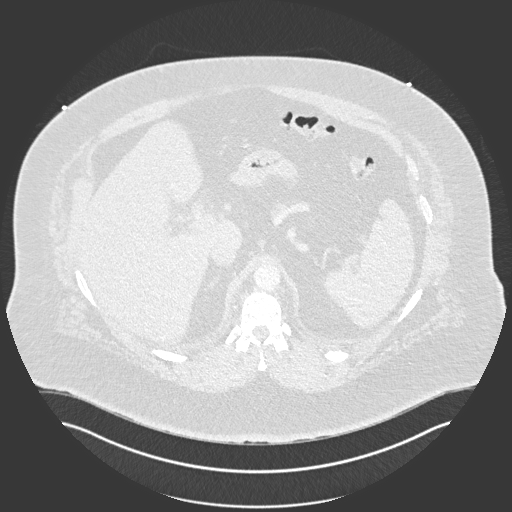
[im 88/320  soft-tissue]
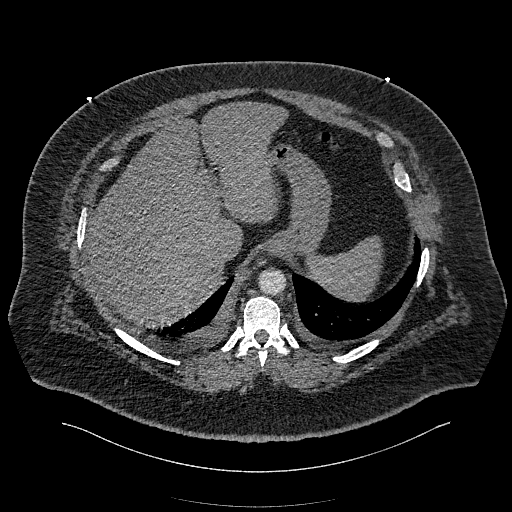
[im 102/320  lung]
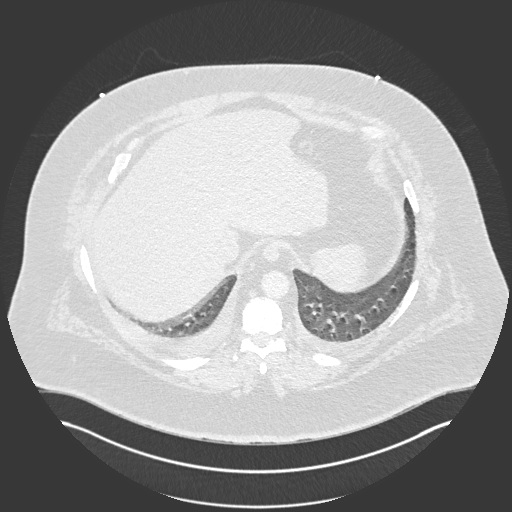
[im 131/320  soft-tissue]
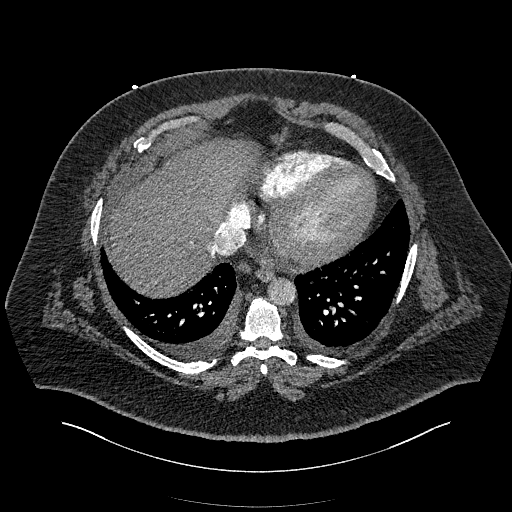
[im 146/320  lung]
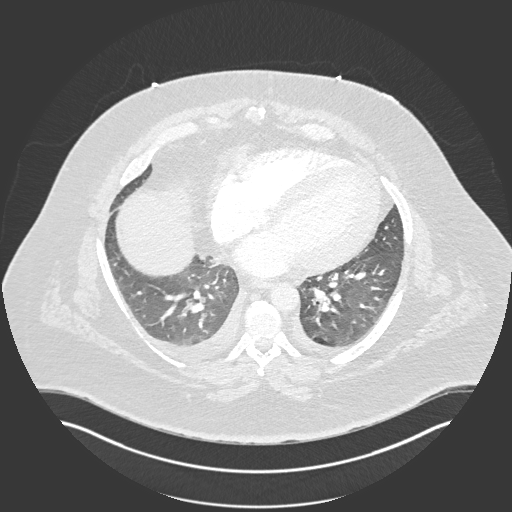
[im 175/320  soft-tissue]
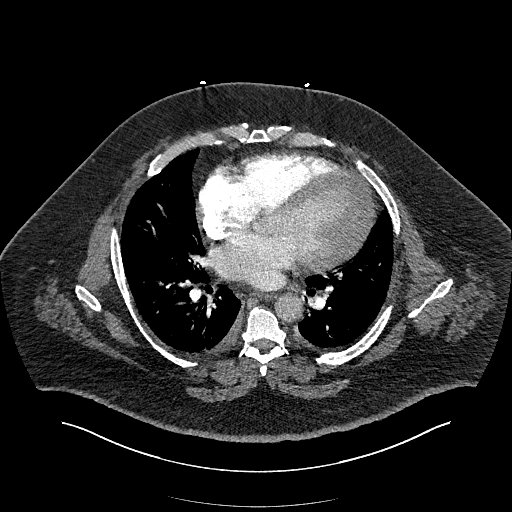
[im 189/320  lung]
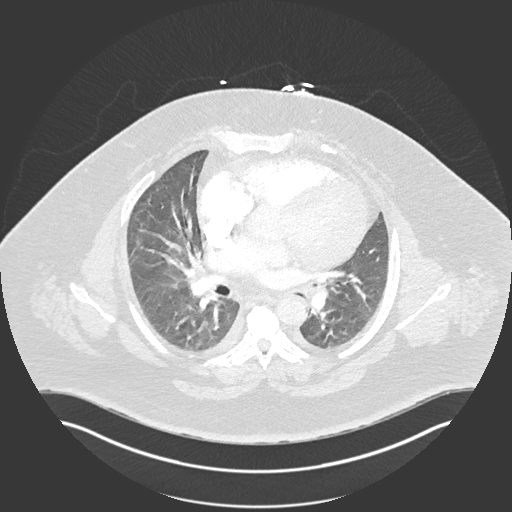
[im 218/320  soft-tissue]
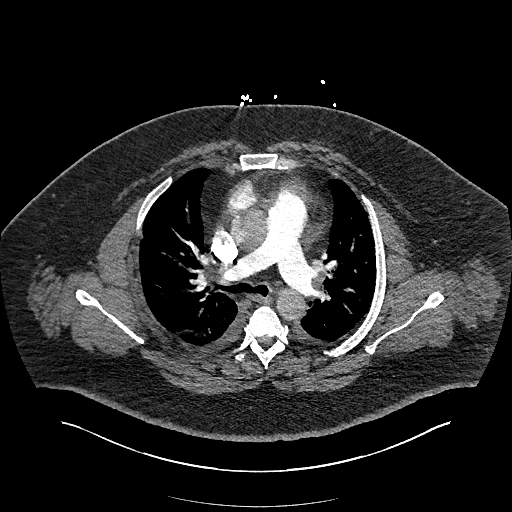
[im 233/320  lung]
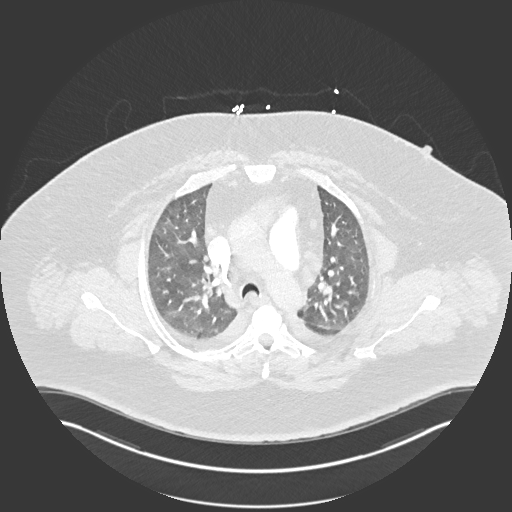
[im 262/320  soft-tissue]
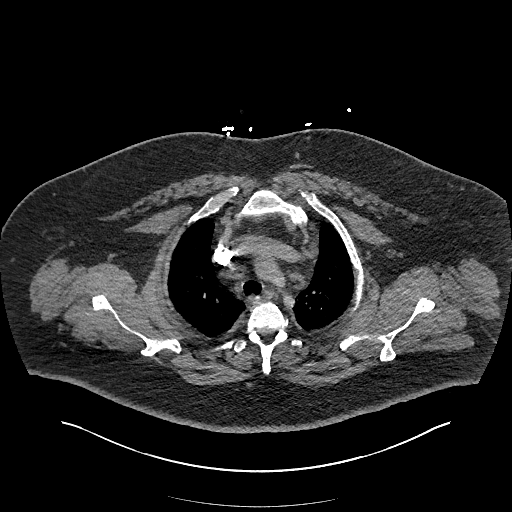
[im 276/320  lung]
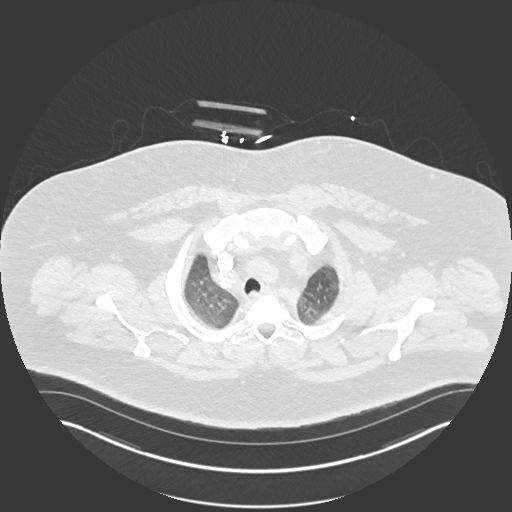
[im 305/320  soft-tissue]
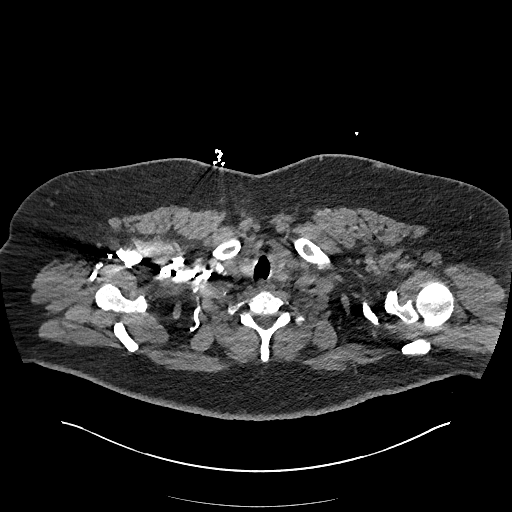

[Series 12: coronal mpr · coronal · 0.64mm/px · 3 of 156 slices shown]
[im 39/156  soft-tissue]
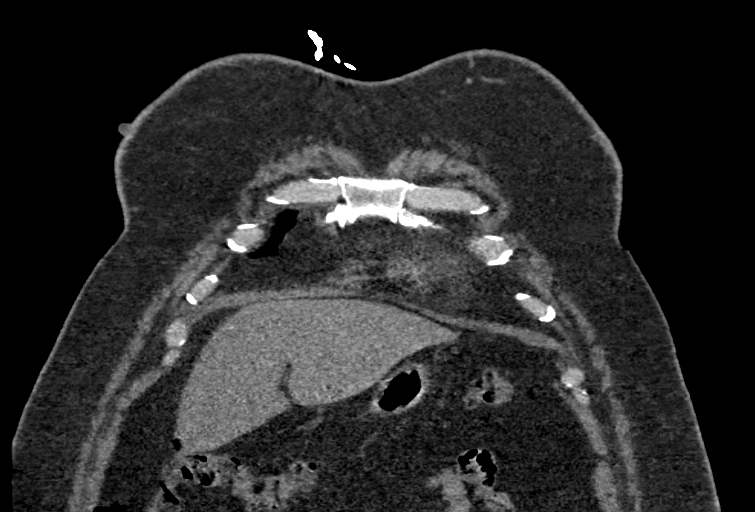
[im 78/156  soft-tissue]
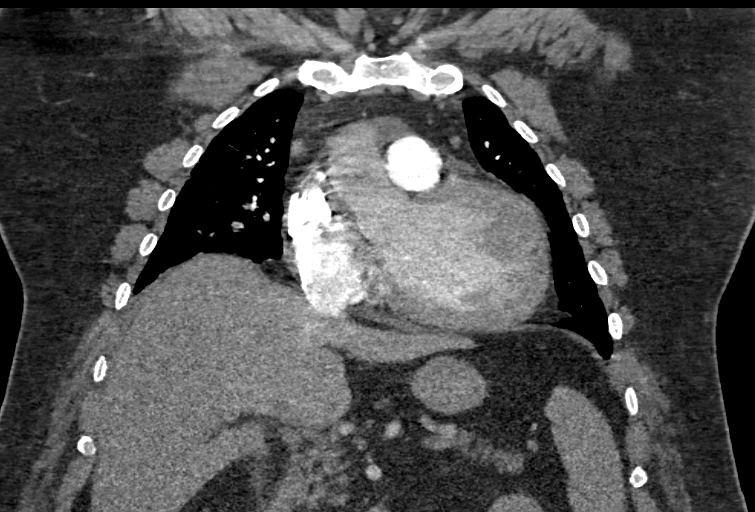
[im 117/156  soft-tissue]
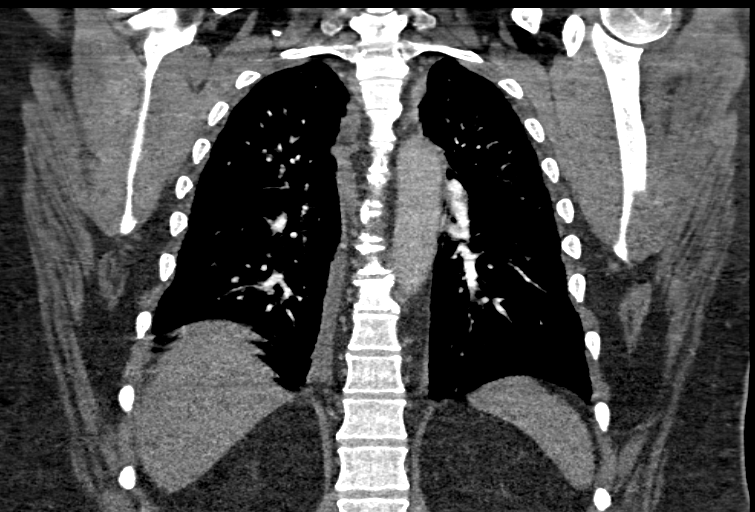

[17 of 46 positions shown; findings below may reference images not displayed]

FINDINGS: Mild-moderate patient 3 motion artifact. Allowing for this, there
are no filling defects in the pulmonary arteries to suggest
pulmonary embolus. The thoracic aorta is normal in caliber.

There is multi chamber cardiomegaly with contrast refluxing into the
IVC and hepatic veins. Small bilateral pleural effusions. There is
no pericardial effusion. There is mild mediastinal and bilateral
hilar adenopathy, likely reactive.

Lung parenchymal evaluation limited due to breathing motion, there
is questionable mild ground-glass opacity diffusely. Linear
atelectasis in the periphery of the right upper lobe. No confluent
airspace disease to suggest pneumonia. No pulmonary mass.

Evaluation of the upper abdomen demonstrates fatty atrophy of the
included pancreas. There is hepatic steatosis.

Degenerative change in the mid thoracic spine. There is no acute or
suspicious osseous abnormality.

Review of the MIP images confirms the above findings.
IMPRESSION: 1. Motion limited exam, no evidence pulmonary embolus.
2. Cardiomegaly, small bilateral pleural effusions and contrast
refluxing into the hepatic vein and IVC, suggestive of heart
failure. Questionable ground-glass opacities may reflect pulmonary
edema.
3. Mild mediastinal and bilateral hilar adenopathy, this is likely
reactive.

## 2016-12-08 ENCOUNTER — Encounter: Payer: Self-pay | Admitting: Cardiology

## 2016-12-19 ENCOUNTER — Other Ambulatory Visit (HOSPITAL_COMMUNITY): Payer: Self-pay | Admitting: Cardiology
